# Patient Record
Sex: Male | Born: 1945 | Race: Black or African American | Hispanic: No | Smoking: Current every day smoker
Health system: Southern US, Community
[De-identification: ages and names within clinical notes are randomized; demographics above are authoritative.]

## PROBLEM LIST (undated history)

## (undated) DIAGNOSIS — I639 Cerebral infarction, unspecified: Secondary | ICD-10-CM

## (undated) DIAGNOSIS — J449 Chronic obstructive pulmonary disease, unspecified: Secondary | ICD-10-CM

## (undated) DIAGNOSIS — R569 Unspecified convulsions: Secondary | ICD-10-CM

## (undated) DIAGNOSIS — F1010 Alcohol abuse, uncomplicated: Secondary | ICD-10-CM

## (undated) DIAGNOSIS — I10 Essential (primary) hypertension: Secondary | ICD-10-CM

## (undated) DIAGNOSIS — M109 Gout, unspecified: Secondary | ICD-10-CM

---

## 2001-11-02 ENCOUNTER — Encounter: Payer: Self-pay | Admitting: *Deleted

## 2001-11-02 ENCOUNTER — Emergency Department (HOSPITAL_COMMUNITY): Admission: EM | Admit: 2001-11-02 | Discharge: 2001-11-02 | Payer: Self-pay | Admitting: *Deleted

## 2002-01-22 ENCOUNTER — Emergency Department (HOSPITAL_COMMUNITY): Admission: EM | Admit: 2002-01-22 | Discharge: 2002-01-22 | Payer: Self-pay | Admitting: Emergency Medicine

## 2002-01-22 ENCOUNTER — Encounter: Payer: Self-pay | Admitting: Emergency Medicine

## 2002-08-21 ENCOUNTER — Inpatient Hospital Stay (HOSPITAL_COMMUNITY): Admission: EM | Admit: 2002-08-21 | Discharge: 2002-08-22 | Payer: Self-pay | Admitting: Emergency Medicine

## 2002-08-21 ENCOUNTER — Encounter: Payer: Self-pay | Admitting: Emergency Medicine

## 2002-08-22 ENCOUNTER — Encounter: Payer: Self-pay | Admitting: *Deleted

## 2005-02-07 ENCOUNTER — Emergency Department (HOSPITAL_COMMUNITY): Admission: EM | Admit: 2005-02-07 | Discharge: 2005-02-07 | Payer: Self-pay | Admitting: Emergency Medicine

## 2005-02-11 ENCOUNTER — Emergency Department (HOSPITAL_COMMUNITY): Admission: EM | Admit: 2005-02-11 | Discharge: 2005-02-11 | Payer: Self-pay | Admitting: Emergency Medicine

## 2005-09-25 ENCOUNTER — Emergency Department (HOSPITAL_COMMUNITY): Admission: EM | Admit: 2005-09-25 | Discharge: 2005-09-25 | Payer: Self-pay | Admitting: Emergency Medicine

## 2006-05-15 ENCOUNTER — Emergency Department (HOSPITAL_COMMUNITY): Admission: EM | Admit: 2006-05-15 | Discharge: 2006-05-15 | Payer: Self-pay | Admitting: Emergency Medicine

## 2006-12-19 ENCOUNTER — Emergency Department (HOSPITAL_COMMUNITY): Admission: EM | Admit: 2006-12-19 | Discharge: 2006-12-19 | Payer: Self-pay | Admitting: Emergency Medicine

## 2008-02-02 ENCOUNTER — Emergency Department (HOSPITAL_COMMUNITY): Admission: EM | Admit: 2008-02-02 | Discharge: 2008-02-02 | Payer: Self-pay | Admitting: Emergency Medicine

## 2008-10-31 ENCOUNTER — Emergency Department (HOSPITAL_COMMUNITY): Admission: EM | Admit: 2008-10-31 | Discharge: 2008-10-31 | Payer: Self-pay | Admitting: Emergency Medicine

## 2010-08-19 NOTE — Discharge Summary (Signed)
   NAME:  Jesus Lin, Jesus Lin                     ACCOUNT NO.:  [id number]   MEDICAL RECORD NO.:  [id number]                   PATIENT TYPE:  INP   LOCATION:  A219                                 FACILITY:  [facility]   PHYSICIAN:  Sarita Bottom, M.D.                  DATE OF BIRTH:  [birth year]   DATE OF ADMISSION:  08/21/2002  DATE OF DISCHARGE:                                 DISCHARGE SUMMARY   ADDENDUM:  Mr.  Dalen had a chest CT done the impression of which showed  no evidence of hilar muscle adenopathy.  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  Patient had a 2.9 cm low density cystic structure adjacent to the left side  of the aorta, probably represents a dilated lymphatic.  This is of  indeterminate etiology but has a benign appearance on CT.   Patient will therefore be discharged home today to followup with primary  M.D. as an outpatient.                                                Sarita Bottom, M.D.    DW/MEDQ  D:  08/22/2002  T:  08/22/2002  Job:  [id number]   cc:   [facility]

## 2010-08-19 NOTE — H&P (Signed)
NAMEROMMEL, HOGSTON                     ACCOUNT NO.:  [id number]   MEDICAL RECORD NO.:  [id number]                   PATIENT TYPE:  INP   LOCATION:  IC04                                 FACILITY:  [facility]   PHYSICIAN:  Sarita Bottom, M.D.                  DATE OF BIRTH:  December 23, 1945   DATE OF ADMISSION:  08/21/2002  DATE OF DISCHARGE:                                HISTORY & PHYSICAL   GENERAL DATA:  History is obtained from ER and EMS records as the patient is  obtunded and cannot give any history.  Patient is unassigned.   CHIEF COMPLAINT:  He is unresponsive.   HISTORY OF PRESENT ILLNESS:  Mr. Jesus Lin is a 65 year old man with  a history of chronic alcohol abuse. He was brought in by the emergency  services after friends called EMS after finding him lying on the street.  It  is unclear, at this time, whether the patient had any seizure or any trauma  prior to being found.  EMS found him unresponsive; and, according to the EMS  note, the patient was apneic.  They gave him D50 and 2 grams of Narcan and  transported him to the [facility] Emergency Room.  In the emergency  room the patient was noted to have spontaneous breathing. He was also given  IV thiamine and folic acid; and a CT scan, and C-spine x-rays were ordered.   REVIEW OF SYSTEMS:  Unable to obtain at this time.   PAST MEDICAL HISTORY:  Significant for alcohol abuse.  The patient has come  to the emergency room in the past, several times, with alcohol abuse.  His  other medical problems are unknown.   MEDICATIONS:  Unknown.   ALLERGIES:  Unknown.   FAMILY HISTORY:  Unable to obtain.   PHYSICAL EXAMINATION:  VITAL SIGNS:  On examination his BP is 140/96.  Heart  rate is 96.  Oxygen saturation is 100.  GENERAL:  He is a middle-aged man with alcohol on his breath, deeply asleep,  but opens his eyes to deep sternal rub and mumbles things and goes back to  sleep.  HEAD, EYES, EARS, NOSE, AND  THROAT:  Pupils are extremely limited and  reactive to light.  He is pale.  He has poor oral hygiene.  He is missing  several of his teeth.  Oral mucosa appears dry.  NECK:  He has a cervical collar on his neck.  CHEST:  Air entry is adequate.  He has a lot of transmitted airway sounds.  No wheezes or crackles could be heard.  CARDIOVASCULAR:  Heart sounds 1 and 2 appear normal.  Rhythm is regular.  No  murmurs were appreciated.  ABDOMEN:  He has a scar on his abdomen.  Abdomen is soft.  Bowel sounds are  present.  No tenderness on deep palpation.  His liver is about 2 cm palpable  below  the right costal edge.  His spleen is not palpable.  No masses or  other organomegaly was felt.  CENTRAL NERVOUS SYSTEM:  He is deeply asleep. He arouses to painful stimuli.  His tone is normal in all extremities.  He has normal reflexes.  However, a  complete neurological examination cannot be done at this time.  EXTREMITIES:  He has no edema.  Pulses are 2+ bilaterally.   X-RAY AND LABORATORY DATA:  A CAT scan of the head and C-spine are all  pending.  His ABG shows a pH of 7.25, pCO2 of 51.5, pO2 of 30.7, bicarb is  22.9.  Oxygen saturation is 99.5%.  WBC is 4.6, MCV is 89.9, platelet count  is 170.  Hemoglobin is 11.6.  Sodium 136, potassium 3.9, chloride 104, CO2  24, BUN 13, creatinine 1.1,  glucose 170, calcium 7.8, calculated anion gap  is 8.  His serum amylase is 109, lipase is 64.  Liver function tests: Total  protein is 6.8, albumin is 3.4, SGOT 37, SGPT 16, and total bilirubin is  0.8.  The serum alcohol level is 505 mg/dL.   ASSESSMENT AND PLAN:  1. Alcohol intoxication.  The patient will be admitted to the ICU for     observation.  I will follow up with a CT of the head that was ordered in     the emergency room.  He will be put under seizure precautions. He will be     given thiamine 100 mg daily and folic acid 1 mg daily.  The patient will     be hydrated with normal saline at 125 cc/h.   When he is much more awake     he will be given Ativan q.8h. p.r.n. for his seizure precautions.  2. Respiratory acidosis. The patient has a normal INR.  I do not think that     the patient has any metabolic acidosis at this time; however, I will go     ahead and check his serum osmolality and serum ketones. He will be given     oxygen 2 L via nasal cannula and his pulse oximetry will be monitored on     admission.  The patient will be admitted under the hospitalist service.     Further management will depend on clinical course.                                               Sarita Bottom, M.D.    DW/MEDQ  D:  08/21/2002  T:  08/21/2002  Job:  [id number]

## 2010-08-19 NOTE — Discharge Summary (Signed)
   NAMEROMARIO, Jesus Lin                     ACCOUNT NO.:  [id number]   MEDICAL RECORD NO.:  [id number]                   PATIENT TYPE:  INP   LOCATION:  IC04                                 FACILITY:  [facility]   PHYSICIAN:  Sarita Bottom, M.D.                  DATE OF BIRTH:  27-Jan-1946   DATE OF ADMISSION:  08/21/2002  DATE OF DISCHARGE:                                 DISCHARGE SUMMARY   DISCHARGE DIAGNOSES:  1. Alcoholic intoxication.  2. Right hilar mass.   DISCHARGE MEDICATIONS:  1. Thiamine 100 mg q.d.  2. Folic acid 1 mg q.d.  3. Multivitamin tablet one tablet q.d.   FOLLOW UP:  The patient advised to see family M.D. for follow up of his  right hilar mass.   HISTORY OF PRESENT ILLNESS:  Jesus Lin is a 65 year old man with a  history of chronic alcohol abuse. He was admitted to [facility]  yesterday after he was found lying on the street. The patient had alcohol on  his breath on arrival in the emergency room. He was obtundent. His serum  alcohol level was elevated at 505 mg/dl. His ABG showed a respiratory  acidosis. He had normal anion gap, and he had negative ketones. The patient  was admitted to the ICU for observation and also for seizure precautions,  hydrated with normal saline. Head CT was done was negative for any acute  mass or bleed. A chest CT done revealed a right hilar mass. His clinical  course was significant for improvement in his mentation, arousal from sleep.  He became alert and responsive and appropriate. He, however, refused further  workup for the right hilar mass and decided to sign out against medical  advice. His vital signs on day of discharge was a BP of 124/86, heart rate  of 85, temperature of 98. Generally, a middle-aged man not in any distress.  His chest is clear to auscultation. Heart sounds 1 and 2 are normal. Rhythm  is regular. No murmur. Abdomen is soft. Bowel sounds are present. CNS is  awake, alert and oriented x3. He  has no tremor, and his latest labs shows a  negative acetone. His serum osmolality was 409 prior to hydration yesterday.  The patient will be discharged home today and advised to follow up with  primary M.D. or to see any doctor in the free clinic.                                               Sarita Bottom, M.D.    DW/MEDQ  D:  08/22/2002  T:  08/22/2002  Job:  [id number]

## 2011-01-03 LAB — BASIC METABOLIC PANEL
BUN: 14
CO2: 14 — ABNORMAL LOW
Calcium: 9.1
Chloride: 102
Creatinine, Ser: 1.48
GFR calc Af Amer: 58 — ABNORMAL LOW
GFR calc non Af Amer: 48 — ABNORMAL LOW
Glucose, Bld: 41 — ABNORMAL LOW
Potassium: 4.1
Sodium: 142

## 2011-01-03 LAB — DIFFERENTIAL
Basophils Absolute: 0
Basophils Relative: 0
Eosinophils Absolute: 0
Eosinophils Relative: 0

## 2011-01-03 LAB — CBC
Platelets: 244
RDW: 15.1

## 2011-01-03 LAB — GLUCOSE, CAPILLARY

## 2011-01-03 LAB — POCT CARDIAC MARKERS: Myoglobin, poc: 64.5

## 2011-08-24 ENCOUNTER — Inpatient Hospital Stay (HOSPITAL_COMMUNITY): Payer: Medicare Other

## 2011-08-24 ENCOUNTER — Emergency Department (HOSPITAL_COMMUNITY): Payer: Medicare Other

## 2011-08-24 ENCOUNTER — Encounter (HOSPITAL_COMMUNITY): Payer: Self-pay | Admitting: *Deleted

## 2011-08-24 ENCOUNTER — Inpatient Hospital Stay (HOSPITAL_COMMUNITY)
Admission: EM | Admit: 2011-08-24 | Discharge: 2011-08-26 | DRG: 066 | Disposition: A | Discharge status: Home Health | Payer: Medicare Other | Attending: Internal Medicine | Admitting: Internal Medicine

## 2011-08-24 DIAGNOSIS — I6350 Cerebral infarction due to unspecified occlusion or stenosis of unspecified cerebral artery: Principal | ICD-10-CM | POA: Diagnosis present

## 2011-08-24 DIAGNOSIS — I63511 Cerebral infarction due to unspecified occlusion or stenosis of right middle cerebral artery: Secondary | ICD-10-CM | POA: Diagnosis present

## 2011-08-24 DIAGNOSIS — I6320 Cerebral infarction due to unspecified occlusion or stenosis of unspecified precerebral arteries: Secondary | ICD-10-CM

## 2011-08-24 DIAGNOSIS — J449 Chronic obstructive pulmonary disease, unspecified: Secondary | ICD-10-CM | POA: Diagnosis present

## 2011-08-24 DIAGNOSIS — F1010 Alcohol abuse, uncomplicated: Secondary | ICD-10-CM

## 2011-08-24 DIAGNOSIS — F1020 Alcohol dependence, uncomplicated: Secondary | ICD-10-CM | POA: Diagnosis present

## 2011-08-24 DIAGNOSIS — T148XXA Other injury of unspecified body region, initial encounter: Secondary | ICD-10-CM | POA: Diagnosis present

## 2011-08-24 DIAGNOSIS — Z9181 History of falling: Secondary | ICD-10-CM

## 2011-08-24 DIAGNOSIS — W19XXXA Unspecified fall, initial encounter: Secondary | ICD-10-CM | POA: Diagnosis present

## 2011-08-24 DIAGNOSIS — Z720 Tobacco use: Secondary | ICD-10-CM | POA: Diagnosis present

## 2011-08-24 DIAGNOSIS — IMO0002 Reserved for concepts with insufficient information to code with codable children: Secondary | ICD-10-CM | POA: Diagnosis present

## 2011-08-24 DIAGNOSIS — R279 Unspecified lack of coordination: Secondary | ICD-10-CM | POA: Diagnosis present

## 2011-08-24 DIAGNOSIS — Z8739 Personal history of other diseases of the musculoskeletal system and connective tissue: Secondary | ICD-10-CM | POA: Diagnosis not present

## 2011-08-24 DIAGNOSIS — F17200 Nicotine dependence, unspecified, uncomplicated: Secondary | ICD-10-CM | POA: Diagnosis present

## 2011-08-24 DIAGNOSIS — J4489 Other specified chronic obstructive pulmonary disease: Secondary | ICD-10-CM | POA: Diagnosis present

## 2011-08-24 DIAGNOSIS — M109 Gout, unspecified: Secondary | ICD-10-CM | POA: Diagnosis present

## 2011-08-24 DIAGNOSIS — I639 Cerebral infarction, unspecified: Secondary | ICD-10-CM

## 2011-08-24 DIAGNOSIS — R29810 Facial weakness: Secondary | ICD-10-CM | POA: Diagnosis present

## 2011-08-24 DIAGNOSIS — L259 Unspecified contact dermatitis, unspecified cause: Secondary | ICD-10-CM | POA: Diagnosis present

## 2011-08-24 HISTORY — DX: Chronic obstructive pulmonary disease, unspecified: J44.9

## 2011-08-24 LAB — CBC
HCT: 41.4 % (ref 39.0–52.0)
Hemoglobin: 13.4 g/dL (ref 13.0–17.0)
RDW: 15.6 % — ABNORMAL HIGH (ref 11.5–15.5)
WBC: 5.8 10*3/uL (ref 4.0–10.5)

## 2011-08-24 LAB — URINALYSIS, ROUTINE W REFLEX MICROSCOPIC
Bilirubin Urine: NEGATIVE
Leukocytes, UA: NEGATIVE
Nitrite: NEGATIVE
Specific Gravity, Urine: >1.03 — ABNORMAL HIGH (ref 1.005–1.030)
pH: 6 (ref 5.0–8.0)

## 2011-08-24 LAB — RAPID URINE DRUG SCREEN, HOSP PERFORMED
Benzodiazepines: NOT DETECTED
Cocaine: NOT DETECTED
Opiates: NOT DETECTED

## 2011-08-24 LAB — DIFFERENTIAL
Basophils Absolute: 0 10*3/uL (ref 0.0–0.1)
Lymphocytes Relative: 11 % — ABNORMAL LOW (ref 12–46)
Monocytes Absolute: 0.6 10*3/uL (ref 0.1–1.0)
Neutro Abs: 4.4 10*3/uL (ref 1.7–7.7)

## 2011-08-24 LAB — BASIC METABOLIC PANEL
CO2: 27 mEq/L (ref 19–32)
Chloride: 102 mEq/L (ref 96–112)
Creatinine, Ser: 1.02 mg/dL (ref 0.50–1.35)

## 2011-08-24 LAB — ETHANOL: Alcohol, Ethyl (B): <11 mg/dL (ref 0–11)

## 2011-08-24 MED ORDER — ACETAMINOPHEN 325 MG PO TABS
650.0000 mg | ORAL_TABLET | Freq: Once | ORAL | Status: AC
  Administered 2011-08-24: 650 mg via ORAL
  Filled 2011-08-24: qty 2

## 2011-08-24 MED ORDER — ACETAMINOPHEN 650 MG RE SUPP: 650.0000 mg | RECTAL | Status: DC | PRN

## 2011-08-24 MED ORDER — PNEUMOCOCCAL VAC POLYVALENT 25 MCG/0.5ML IJ INJ
0.5000 mL | INJECTION | INTRAMUSCULAR | Status: AC
  Administered 2011-08-25: 0.5 mL via INTRAMUSCULAR
  Filled 2011-08-24: qty 0.5

## 2011-08-24 MED ORDER — SENNOSIDES-DOCUSATE SODIUM 8.6-50 MG PO TABS: 1.0000 | ORAL_TABLET | Freq: Every evening | ORAL | Status: DC | PRN

## 2011-08-24 MED ORDER — THIAMINE HCL 100 MG/ML IJ SOLN: 100.0000 mg | Freq: Every day | INTRAMUSCULAR | Status: DC

## 2011-08-24 MED ORDER — ACETAMINOPHEN 325 MG PO TABS: 650.0000 mg | ORAL_TABLET | ORAL | Status: DC | PRN

## 2011-08-24 MED ORDER — ENOXAPARIN SODIUM 40 MG/0.4ML ~~LOC~~ SOLN
40.0000 mg | SUBCUTANEOUS | Status: DC
  Administered 2011-08-24 – 2011-08-25 (×2): 40 mg via SUBCUTANEOUS
  Filled 2011-08-24 (×2): qty 0.4

## 2011-08-24 MED ORDER — ADULT MULTIVITAMIN W/MINERALS CH
1.0000 | ORAL_TABLET | Freq: Every day | ORAL | Status: DC
  Administered 2011-08-24 – 2011-08-26 (×3): 1 via ORAL

## 2011-08-24 MED ORDER — FOLIC ACID 1 MG PO TABS
1.0000 mg | ORAL_TABLET | Freq: Every day | ORAL | Status: DC
  Administered 2011-08-24 – 2011-08-26 (×3): 1 mg via ORAL
  Filled 2011-08-24 (×3): qty 1

## 2011-08-24 MED ORDER — ONDANSETRON HCL 4 MG/2ML IJ SOLN: 4.0000 mg | Freq: Four times a day (QID) | INTRAMUSCULAR | Status: DC | PRN

## 2011-08-24 MED ORDER — LORAZEPAM 1 MG PO TABS: 1.0000 mg | ORAL_TABLET | Freq: Four times a day (QID) | ORAL | Status: DC | PRN

## 2011-08-24 MED ORDER — LORAZEPAM 2 MG/ML IJ SOLN: 1.0000 mg | Freq: Four times a day (QID) | INTRAMUSCULAR | Status: DC | PRN

## 2011-08-24 MED ORDER — ASPIRIN 325 MG PO TABS
325.0000 mg | ORAL_TABLET | Freq: Every day | ORAL | Status: DC
  Administered 2011-08-24 – 2011-08-26 (×3): 325 mg via ORAL
  Filled 2011-08-24 (×3): qty 1

## 2011-08-24 MED ORDER — VITAMIN B-1 100 MG PO TABS
100.0000 mg | ORAL_TABLET | Freq: Every day | ORAL | Status: DC
  Administered 2011-08-24 – 2011-08-26 (×3): 100 mg via ORAL
  Filled 2011-08-24 (×3): qty 1

## 2011-08-24 MED ORDER — ASPIRIN 300 MG RE SUPP
300.0000 mg | Freq: Every day | RECTAL | Status: DC
  Filled 2011-08-24 (×5): qty 1

## 2011-08-24 NOTE — ED Notes (Signed)
Pt says his lt foot is "sore"

## 2011-08-24 NOTE — ED Notes (Addendum)
Denies chest pain, states he has been short of breath for one year. Patient c/o right foot pain, no known injury. No obvious signs of injury, no redness. States the pain woke him up last night.  Patient's family reports that he seems disoriented and not his "usual self" Patient does seem confused with tasks, but answers orientation questions appropriately and accurately.

## 2011-08-24 NOTE — ED Notes (Signed)
Sob, "passed out last week", Family says that he cannot walk without assistance that his gait is unsteady.

## 2011-08-24 NOTE — ED Provider Notes (Addendum)
History   This chart was scribed for Donnetta Hutching, MD by Clarita Crane. The patient was seen in room APA08/APA08. Patient's care was started at 1038.    CSN: [id number]  Arrival date & time 08/24/11  1038   First MD Initiated Contact with Patient 08/24/11 1211      Chief Complaint  Patient presents with  . Shortness of Breath    (Consider location/radiation/quality/duration/timing/severity/associated sxs/prior treatment) The history is provided by the patient. History Limited By: Poor Historian.  Jesus Lin is a 66 y.o. male who presents to the Emergency Department accompanied by brother to be evaluated due to syncopal episode which reportedly occurred last week. Patient is amnestic to events of syncopal episode and states that he would not have come to ED to be evaluated if it weren't for the insistence of his brother. Patient states he feels he is at his baseline and has no other complaints at this time. Denies chest pain, nausea, vomiting, diarrhea, weakness. Patient with h/o COPD.   Past Medical History  Diagnosis Date  . COPD (chronic obstructive pulmonary disease)     History reviewed. No pertinent past surgical history.  History reviewed. No pertinent family history.  History  Substance Use Topics  . Smoking status: Current Everyday Smoker  . Smokeless tobacco: Not on file  . Alcohol Use: Yes      Review of Systems A complete 10 system review of systems was obtained and all systems are negative except as noted in the HPI and PMH.   Allergies  Review of patient's allergies indicates no known allergies.  Home Medications  No current outpatient prescriptions on file.  BP 153/98  Pulse 78  Temp(Src) 98.1 F (36.7 C) (Oral)  Resp 20  Ht 5\' 11"  (1.803 m)  Wt 162 lb (73.483 kg)  BMI 22.59 kg/m2  SpO2 100%  Physical Exam  Nursing note and vitals reviewed. Constitutional: He is oriented to person, place, and time. He appears well-developed. No distress.        Thin appearing.   HENT:  Head: Normocephalic and atraumatic.       Poor dentition.   Eyes: EOM are normal. Pupils are equal, round, and reactive to light.  Neck: Neck supple. No tracheal deviation present.  Cardiovascular: Normal rate and regular rhythm.  Exam reveals no gallop and no friction rub.   No murmur heard. Pulmonary/Chest: Effort normal. No respiratory distress. He has no wheezes. He has no rales.  Abdominal: Soft. He exhibits no distension.  Musculoskeletal: Normal range of motion. He exhibits no edema.  Neurological: He is alert and oriented to person, place, and time. No sensory deficit.  Skin: Skin is warm and dry.  Psychiatric: He has a normal mood and affect. His behavior is normal.    ED Course  Procedures (including critical care time)  DIAGNOSTIC STUDIES: Oxygen Saturation is 100% on room air, normal by my interpretation.    COORDINATION OF CARE: 12:30PM-Patient informed of current plan for treatment and evaluation and agrees with plan at this time.   Results for orders placed during the hospital encounter of 08/24/11  CBC      Component Value Range   WBC 5.8  4.0 - 10.5 (K/uL)   RBC 4.59  4.22 - 5.81 (MIL/uL)   Hemoglobin 13.4  13.0 - 17.0 (g/dL)   HCT 78.2  95.6 - 21.3 (%)   MCV 90.2  78.0 - 100.0 (fL)   MCH 29.2  26.0 - 34.0 (pg)  MCHC 32.4  30.0 - 36.0 (g/dL)   RDW 16.1 (*) 09.6 - 15.5 (%)   Platelets 245  150 - 400 (K/uL)  DIFFERENTIAL      Component Value Range   Neutrophils Relative 77  43 - 77 (%)   Neutro Abs 4.4  1.7 - 7.7 (K/uL)   Lymphocytes Relative 11 (*) 12 - 46 (%)   Lymphs Abs 0.6 (*) 0.7 - 4.0 (K/uL)   Monocytes Relative 11  3 - 12 (%)   Monocytes Absolute 0.6  0.1 - 1.0 (K/uL)   Eosinophils Relative 1  0 - 5 (%)   Eosinophils Absolute 0.1  0.0 - 0.7 (K/uL)   Basophils Relative 0  0 - 1 (%)   Basophils Absolute 0.0  0.0 - 0.1 (K/uL)  BASIC METABOLIC PANEL      Component Value Range   Sodium 141  135 - 145 (mEq/L)    Potassium 3.7  3.5 - 5.1 (mEq/L)   Chloride 102  96 - 112 (mEq/L)   CO2 27  19 - 32 (mEq/L)   Glucose, Bld 102 (*) 70 - 99 (mg/dL)   BUN 8  6 - 23 (mg/dL)   Creatinine, Ser 0.45  0.50 - 1.35 (mg/dL)   Calcium 9.1  8.4 - 40.9 (mg/dL)   GFR calc non Af Amer 76 (*) >90 (mL/min)   GFR calc Af Amer 88 (*) >90 (mL/min)  ETHANOL      Component Value Range   Alcohol, Ethyl (B) <11  0 - 11 (mg/dL)  POCT I-STAT TROPONIN I      Component Value Range   Troponin i, poc 0.02  0.00 - 0.08 (ng/mL)   Comment 3             Labs Reviewed  CBC - Abnormal; Notable for the following:    RDW 15.6 (*)    All other components within normal limits  DIFFERENTIAL - Abnormal; Notable for the following:    Lymphocytes Relative 11 (*)    Lymphs Abs 0.6 (*)    All other components within normal limits  POCT I-STAT TROPONIN I  BASIC METABOLIC PANEL  ETHANOL  URINALYSIS, ROUTINE W REFLEX MICROSCOPIC  URINE RAPID DRUG SCREEN (HOSP PERFORMED)   Dg Chest 1 View  08/24/2011  *RADIOLOGY REPORT*  Clinical Data: Dyspnea.  CHEST - 1 VIEW  Comparison: 02/02/2008.  Findings: The heart remains normal in size.  The lungs remain clear and hyperexpanded.  Tortuous aorta.  Mild scoliosis.  IMPRESSION: Stable changes of COPD.  No acute abnormality.  Original Report Authenticated By: Darrol Angel, M.D.  Dg Chest 1 View  08/24/2011  *RADIOLOGY REPORT*  Clinical Data: Dyspnea.  CHEST - 1 VIEW  Comparison: 02/02/2008.  Findings: The heart remains normal in size.  The lungs remain clear and hyperexpanded.  Tortuous aorta.  Mild scoliosis.  IMPRESSION: Stable changes of COPD.  No acute abnormality.  Original Report Authenticated By: Darrol Angel, M.D.   Ct Head Wo Contrast  08/24/2011  *RADIOLOGY REPORT*  Clinical Data: Syncope last week.  Unsteady gait  CT HEAD WITHOUT CONTRAST  Technique:  Contiguous axial images were obtained from the base of the skull through the vertex without contrast.  Comparison: CT 02/07/2005  Findings:  Hypodensity in the right insular cortex and right parietal lobe compatible with acute infarct.  There may be mild involvement of the right basal ganglia as well.  This infarct involves approximately one third of the right MCA territory. Negative  for hemorrhage.  No midline shift.  Generalized atrophy of a moderate degree.  No hemorrhage or mass lesion.  Increased density in the distal right middle cerebral artery in the sylvian fissure may represent thrombosis.  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  Critical Value/emergent results were called by telephone at the time of interpretation on 08/24/2011  at 1510 hours  to  Dr. Adriana Simas, who verbally acknowledged these results.  Original Report Authenticated By: Camelia Phenes, M.D.    No diagnosis found.   Date: 08/24/2011  Rate: 76  Rhythm: normal sinus rhythm  QRS Axis: left  Intervals: normal  ST/T Wave abnormalities: normal  Conduction Disutrbances:none  Narrative Interpretation:   Old EKG Reviewed: changes noted CRITICAL CARE Performed by: Donnetta Hutching  ?  Total critical care time: 30  Critical care time was exclusive of separately billable procedures and treating other patients.  Critical care was necessary to treat or prevent imminent or life-threatening deterioration.  Critical care was time spent personally by me on the following activities: development of treatment plan with patient and/or surrogate as well as nursing, discussions with consultants, evaluation of patient's response to treatment, examination of patient, obtaining history from patient or surrogate, ordering and performing treatments and interventions, ordering and review of laboratory studies, ordering and review of radiographic studies, pulse oximetry and re-evaluation of patient's condition.  MDM  patient has known COPD.  He is a heavy drinker.  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  Discussed with general medicine. Admit.      I personally performed the services described in this documentation, which was scribed in my presence. The recorded information has been reviewed and considered.    Donnetta Hutching, MD 08/24/11 1437  Donnetta Hutching, MD 08/24/11 1446  Donnetta Hutching, MD 08/24/11 [phone]

## 2011-08-24 NOTE — ED Notes (Signed)
Report given to Cindy, RN unit 300. Ready to receive patient. 
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
## 2011-08-25 ENCOUNTER — Inpatient Hospital Stay (HOSPITAL_COMMUNITY): Payer: Medicare Other

## 2011-08-25 DIAGNOSIS — I6320 Cerebral infarction due to unspecified occlusion or stenosis of unspecified precerebral arteries: Secondary | ICD-10-CM

## 2011-08-25 DIAGNOSIS — Z8739 Personal history of other diseases of the musculoskeletal system and connective tissue: Secondary | ICD-10-CM | POA: Diagnosis not present

## 2011-08-25 DIAGNOSIS — I369 Nonrheumatic tricuspid valve disorder, unspecified: Secondary | ICD-10-CM

## 2011-08-25 DIAGNOSIS — M109 Gout, unspecified: Secondary | ICD-10-CM

## 2011-08-25 DIAGNOSIS — F1010 Alcohol abuse, uncomplicated: Secondary | ICD-10-CM

## 2011-08-25 LAB — URIC ACID: Uric Acid, Serum: 8.9 mg/dL — ABNORMAL HIGH (ref 4.0–7.8)

## 2011-08-25 LAB — HEMOGLOBIN A1C
Hgb A1c MFr Bld: 5.6 % (ref <5.7)
Mean Plasma Glucose: 114 mg/dL (ref <117)

## 2011-08-25 LAB — LIPID PANEL: LDL Cholesterol: 83 mg/dL (ref 0–99)

## 2011-08-25 MED ORDER — SODIUM CHLORIDE 0.9 % IJ SOLN
INTRAMUSCULAR | Status: AC
  Administered 2011-08-25: 22:00:00
  Filled 2011-08-25: qty 3

## 2011-08-25 MED ORDER — COLCHICINE 0.6 MG PO TABS
1.2000 mg | ORAL_TABLET | Freq: Once | ORAL | Status: AC
  Administered 2011-08-25: 1.2 mg via ORAL
  Filled 2011-08-25: qty 2

## 2011-08-25 MED ORDER — CLONIDINE HCL 0.1 MG PO TABS: 0.1000 mg | ORAL_TABLET | Freq: Four times a day (QID) | ORAL | Status: DC | PRN

## 2011-08-25 MED ORDER — SODIUM CHLORIDE 0.9 % IJ SOLN
INTRAMUSCULAR | Status: AC
  Administered 2011-08-25: 10 mL
  Filled 2011-08-25: qty 3

## 2011-08-25 MED ORDER — HYDROCORTISONE 1 % EX CREA
TOPICAL_CREAM | Freq: Two times a day (BID) | CUTANEOUS | Status: DC
  Administered 2011-08-25 (×2): 1 via TOPICAL
  Administered 2011-08-26: 10:00:00 via TOPICAL
  Filled 2011-08-25 (×2): qty 1.5

## 2011-08-25 MED ORDER — COLCHICINE 0.6 MG PO TABS
0.6000 mg | ORAL_TABLET | Freq: Every day | ORAL | Status: DC
  Administered 2011-08-26: 0.6 mg via ORAL
  Filled 2011-08-25: qty 1

## 2011-08-25 MED ORDER — INDOMETHACIN 25 MG PO CAPS
50.0000 mg | ORAL_CAPSULE | Freq: Two times a day (BID) | ORAL | Status: DC
  Administered 2011-08-25: 50 mg via ORAL
  Filled 2011-08-25: qty 2

## 2011-08-25 NOTE — Progress Notes (Signed)
Subjective: Complaining of foot pain. Unable to work with physical therapy because unable to bear weight. Has had gout in the past. Now left foot hurts as well.  Objective: Vital signs in last 24 hours: Filed Vitals:   08/24/11 2126 08/25/11 0518 08/25/11 1000 08/25/11 1400  BP: 127/80 169/100 125/77 117/74  Pulse: 59 55 58 65  Temp: 98.7 F (37.1 C) 98.6 F (37 C)  98.4 F (36.9 C)  TempSrc: Oral Oral  Oral  Resp: 18 18  18   Height:      Weight:      SpO2: 95% 96%  96%   Weight change:   Intake/Output Summary (Last 24 hours) at 08/25/11 1810 Last data filed at 08/25/11 1803  Gross per 24 hour  Intake    600 ml  Output    375 ml  Net    225 ml   General: Alert oriented and appropriate Lungs clear to auscultation bilaterally without wheeze rhonchi or rales Cardiovascular regular rate rhythm without murmurs gas rubs Abdomen soft nontender nondistended Extremities no clubbing or cyanosis left MTP joint is now erythematous and tender. Right foot has erythema along the medial instep which was not previous yesterday. Slight tender mass. Neurologic: Finger to nose slightly better on the left compared to yesterday. Still slight weakness of the left arm and leg. Otherwise exam unchanged.  Lab Results: Basic Metabolic Panel:  Lab 08/24/11 1610  NA 141  K 3.7  CL 102  CO2 27  GLUCOSE 102*  BUN 8  CREATININE 1.02  CALCIUM 9.1  MG --  PHOS --   Liver Function Tests: No results found for this basename: AST:2,ALT:2,ALKPHOS:2,BILITOT:2,PROT:2,ALBUMIN:2 in the last 168 hours No results found for this basename: LIPASE:2,AMYLASE:2 in the last 168 hours No results found for this basename: AMMONIA:2 in the last 168 hours CBC:  Lab 08/24/11 1316  WBC 5.8  NEUTROABS 4.4  HGB 13.4  HCT 41.4  MCV 90.2  PLT 245   Cardiac Enzymes: No results found for this basename: CKTOTAL:3,CKMB:3,CKMBINDEX:3,TROPONINI:3 in the last 168 hours BNP: No results found for this basename: PROBNP:3  in the last 168 hours D-Dimer: No results found for this basename: DDIMER:2 in the last 168 hours CBG: No results found for this basename: GLUCAP:6 in the last 168 hours Hemoglobin A1C:  Lab 08/25/11 0525  HGBA1C 5.6   Fasting Lipid Panel:  Lab 08/25/11 0531  CHOL 151  HDL 52  LDLCALC 83  TRIG 82  CHOLHDL 2.9  LDLDIRECT --   Thyroid Function Tests: No results found for this basename: TSH,T4TOTAL,FREET4,T3FREE,THYROIDAB in the last 168 hours Coagulation: No results found for this basename: LABPROT:4,INR:4 in the last 168 hours Anemia Panel: No results found for this basename: VITAMINB12,FOLATE,FERRITIN,TIBC,IRON,RETICCTPCT in the last 168 hours Urine Drug Screen: Drugs of Abuse     Component Value Date/Time   LABOPIA NONE DETECTED 08/24/2011 1451   COCAINSCRNUR NONE DETECTED 08/24/2011 1451   LABBENZ NONE DETECTED 08/24/2011 1451   AMPHETMU NONE DETECTED 08/24/2011 1451   THCU NONE DETECTED 08/24/2011 1451   LABBARB NONE DETECTED 08/24/2011 1451    Alcohol Level:  Lab 08/24/11 1316  ETH <11   Urinalysis:  Lab 08/24/11 1451  COLORURINE YELLOW  LABSPEC >1.030*  PHURINE 6.0  GLUCOSEU NEGATIVE  HGBUR NEGATIVE  BILIRUBINUR NEGATIVE  KETONESUR NEGATIVE  PROTEINUR NEGATIVE  UROBILINOGEN 0.2  NITRITE NEGATIVE  LEUKOCYTESUR NEGATIVE    Micro Results: No results found for this or any previous visit (from the past 240 hour(s)). Studies/Results:  Dg Chest 1 View  08/24/2011  *RADIOLOGY REPORT*  Clinical Data: Dyspnea.  CHEST - 1 VIEW  Comparison: 02/02/2008.  Findings: The heart remains normal in size.  The lungs remain clear and hyperexpanded.  Tortuous aorta.  Mild scoliosis.  IMPRESSION: Stable changes of COPD.  No acute abnormality.  Original Report Authenticated By: Darrol Angel, M.D.   Ct Head Wo Contrast  08/24/2011  *RADIOLOGY REPORT*  Clinical Data: Syncope last week.  Unsteady gait  CT HEAD WITHOUT CONTRAST  Technique:  Contiguous axial images were obtained  from the base of the skull through the vertex without contrast.  Comparison: CT 02/07/2005  Findings: Hypodensity in the right insular cortex and right parietal lobe compatible with acute infarct.  There may be mild involvement of the right basal ganglia as well.  This infarct involves approximately one third of the right MCA territory. Negative for hemorrhage.  No midline shift.  Generalized atrophy of a moderate degree.  No hemorrhage or mass lesion.  Increased density in the distal right middle cerebral artery in the sylvian fissure may represent thrombosis.  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  Critical Value/emergent results were called by telephone at the time of interpretation on 08/24/2011  at 1510 hours  to  Dr. Adriana Simas, who verbally acknowledged these results.  Original Report Authenticated By: Camelia Phenes, M.D.   Mr Brain Wo Contrast  08/25/2011  *RADIOLOGY REPORT*  Clinical Data:  .  Weakness.  Left-sided facial droop.  MRI HEAD WITHOUT CONTRAST MRA HEAD WITHOUT CONTRAST  Technique: Multiplanar, multiecho pulse sequences of the brain and surrounding structures were obtained according to standard protocol without intravenous contrast.  Angiographic images of the head were obtained using MRA technique without contrast.  Comparison: 08/24/2011 head CT.  No comparison MR.  MRI HEAD  Findings:  Motion degraded exam.  Large right middle cerebral artery distribution acute non hemorrhagic infarct.  Mild mass effect upon the right lateral ventricle.  Minimal bowing of the septum to the left.  This infarct extends from the right temporal lobe, involving the right opercular region and right basal ganglia into the right frontal lobe and parietal lobe.  Atrophy without hydrocephalus.  Small vessel disease type changes.  No intracranial mass lesion detected on this unenhanced motion degraded exam.  Abnormal appearance of the right middle cerebral artery.  Please see below.  Mild  exophthalmos.  Paranasal sinus minimal to mild mucosal thickening.  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  MRA HEAD  Findings: Exam limited by motion.  This limits the evaluation for grading stenosis or detecting aneurysm.  There is flow within the M1 segment of the right middle cerebral artery however, decreased number of visualized right middle cerebral artery branches consistent with occlusion contributing to the patient's infarct.  Intracranial vasculature appears ectatic with branch vessel narrowing and irregularity suspected but not able to be adequately assessed.  IMPRESSION: Exam limited by motion.  This limits the evaluation for grading stenosis or detecting aneurysm.  There is flow within the M1 segment of the right middle cerebral artery however, decreased number of visualized right middle cerebral artery branches consistent with occlusion contributing to the patient's infarct.  Original Report Authenticated By: Fuller Canada, M.D.   US Carotid Duplex Bilateral  08/25/2011  *RADIOLOGY REPORT*  Clinical Data: Acute cerebral infarction in the right middle cerebral artery territory. History of hypertension and  tobacco use.  BILATERAL CAROTID DUPLEX ULTRASOUND  Technique: Wallace Cullens scale imaging, color Doppler and duplex ultrasound was performed of bilateral carotid and vertebral arteries in the neck.  Comparison:  None.  Criteria:  Quantification of carotid stenosis is based on velocity parameters that correlate the residual internal carotid diameter with NASCET-based stenosis levels, using the diameter of the distal internal carotid lumen as the denominator for stenosis measurement.  The following velocity measurements were obtained:                   PEAK SYSTOLIC/END DIASTOLIC RIGHT ICA:                        90/21cm/sec CCA:                        105/10cm/sec SYSTOLIC ICA/CCA RATIO:      0.9 DIASTOLIC ICA/CCA RATIO:    2.1 ECA:                        97cm/sec  LEFT ICA:                        87/19cm/sec CCA:                        139/18cm/sec SYSTOLIC ICA/CCA RATIO:     0.6 DIASTOLIC ICA/CCA RATIO:    1.0 ECA:                        97cm/sec  Findings:  RIGHT CAROTID ARTERY: Moderate amount of calcified and noncalcified plaque present beginning at the level of the distal common carotid artery and extending through the bulb and into origins of both internal and external carotid arteries.  Plaque surface is irregular with color Doppler showing focal areas of probable plaque ulceration in the proximal internal carotid artery.  This may certainly have been a source of the emboli. Estimated right ICA stenosis is less than 50%.  RIGHT VERTEBRAL ARTERY:  Antegrade flow with normal wave form.  LEFT CAROTID ARTERY: Moderate amount of calcified and noncalcified plaque present.  Similar to the right side, focal areas of plaque ulceration are suspected.  Estimated ICA stenosis is less than 50% based on velocities.  LEFT VERTEBRAL ARTERY:  Antegrade flow with normal wave form.  IMPRESSION:  Significant atherosclerotic plaque at both carotid bifurcations. Although estimated ICA stenoses are less 50% based on velocities, focal areas of plaque ulceration are suspected bilaterally which certainly may be foci for emboli.  Original Report Authenticated By: Reola Calkins, M.D.   Mr Mra Head/brain Wo Cm  08/25/2011  *RADIOLOGY REPORT*  Clinical Data:  .  Weakness.  Left-sided facial droop.  MRI HEAD WITHOUT CONTRAST MRA HEAD WITHOUT CONTRAST  Technique: Multiplanar, multiecho pulse sequences of the brain and surrounding structures were obtained according to standard protocol without intravenous contrast.  Angiographic images of the head were obtained using MRA technique without contrast.  Comparison: 08/24/2011 head CT.  No comparison MR.  MRI HEAD  Findings:  Motion degraded exam.  Large right middle cerebral  artery distribution acute non hemorrhagic infarct.  Mild mass effect upon the right lateral ventricle.  Minimal bowing of the septum to the left.  This infarct extends from the right temporal lobe, involving the right opercular region and right basal ganglia into the right frontal lobe  and parietal lobe.  Atrophy without hydrocephalus.  Small vessel disease type changes.  No intracranial mass lesion detected on this unenhanced motion degraded exam.  Abnormal appearance of the right middle cerebral artery.  Please see below.  Mild exophthalmos.  Paranasal sinus minimal to mild mucosal thickening.  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  MRA HEAD  Findings: Exam limited by motion.  This limits the evaluation for grading stenosis or detecting aneurysm.  There is flow within the M1 segment of the right middle cerebral artery however, decreased number of visualized right middle cerebral artery branches consistent with occlusion contributing to the patient's infarct.  Intracranial vasculature appears ectatic with branch vessel narrowing and irregularity suspected but not able to be adequately assessed.  IMPRESSION: Exam limited by motion.  This limits the evaluation for grading stenosis or detecting aneurysm.  There is flow within the M1 segment of the right middle cerebral artery however, decreased number of visualized right middle cerebral artery branches consistent with occlusion contributing to the patient's infarct.  Original Report Authenticated By: Fuller Canada, M.D.   Scheduled Meds:   . aspirin  325 mg Oral Daily  . colchicine  0.6 mg Oral Daily  . colchicine  1.2 mg Oral Once  . enoxaparin  40 mg Subcutaneous Q24H  . folic acid  1 mg Oral Daily  . hydrocortisone cream   Topical BID  . indomethacin  50 mg Oral BID WC  . mulitivitamin with minerals  1 tablet Oral Daily  . pneumococcal  23 valent vaccine  0.5 mL Intramuscular Tomorrow-1000  . thiamine  100 mg Oral Daily  . DISCONTD: aspirin  300 mg Rectal Daily  . DISCONTD: thiamine  100 mg Intravenous Daily   Continuous Infusions:  PRN Meds:.acetaminophen, cloNIDine, LORazepam, LORazepam, ondansetron (ZOFRAN) IV, senna-docusate, DISCONTD: acetaminophen Assessment/Plan: Principal Problem: Subacute ischemic right MCA stroke with right MCA occlusion noted on MRA, and ulcerated plaques on carotid Dopplers, likely embolic. Because of the subacute nature of the stroke, patient's continued improvement, large stroke area, alcohol abuse and fall risk, will not start anticoagulation because of risk of bleed/hemorrhagic conversion, et Karie Soda. Continue PT, OT. Patient does not wish to quit drinking or smoking. Symptoms are improving.  Alcohol abuse, no evidence of withdrawal.  Tobacco abuse, does not wish to quit. Counseled against.  Acute gout of the left MTP and probably right foot. Will give a few doses of colchicine and limited indomethacin. Check a uric acid.   LOS: 1 day   Errin Chewning L 08/25/2011, 6:10 PM

## 2011-08-25 NOTE — Clinical Social Work Psychosocial (Signed)
Clinical Social Work Department BRIEF PSYCHOSOCIAL ASSESSMENT 08/25/2011  Patient:  Jesus Lin, Jesus Lin     Account Number:  [id number]     Admit date:  08/24/2011  Clinical Social Worker:  Sherrlyn Hock  Date/Time:  08/25/2011 09:00 AM  Referred by:  Physician  Date Referred:  08/25/2011 Referred for  Other - See comment   Other Referral:   loss of mother   Interview type:  Patient Other interview type:    PSYCHOSOCIAL DATA Living Status:  ALONE Admitted from facility:   Level of care:   Primary support name:  Mitzi Davenport Primary support relationship to patient:  SIBLING Degree of support available:   adequate    CURRENT CONCERNS Current Concerns  Other - See comment   Other Concerns:   loss of mother    SOCIAL WORK ASSESSMENT / PLAN CSW met with pt at bedside following MD referral for the loss of his mother.  Pt alert and oriented and reports he has lived alone for the past month since his mother's death.  Pt's sister is best support and plans to move in with pt by pt report.  Pt has history of heavy drinking but states he has no desire to cut back at all.  No change in amount since his mother's death.  Pt feels he is coping relatively well concerning the loss of his mother and does not feel the need to follow up with any grief support or counseling.  Chaplain also consulted.  CSW provided brief support.   Assessment/plan status:  No Further Intervention Required Other assessment/ plan:   Information/referral to community resources:   Pt declines any need for resource referral    PATIENT'S/FAMILY'S RESPONSE TO PLAN OF CARE: Pt plans to return home at d/c to live with his sister. Per History and Physical, pt counseled extensively to stop drinking but pt has no intention to comply with this.  CSW will sign off unless needs arise prior to d/c.        Derenda Fennel, LCSW [phone]

## 2011-08-25 NOTE — Progress Notes (Signed)
*  PRELIMINARY RESULTS* Echocardiogram 2D Echocardiogram has been performed.  Jesus Lin 08/25/2011, 9:07 AM

## 2011-08-25 NOTE — Evaluation (Signed)
Physical Therapy Evaluation Patient Details Name: Jesus Lin MRN: [id number] DOB: July 15, 1945 Today's Date: 08/25/2011 Time: 9528-4132 PT Time Calculation (min): 45 min  PT Assessment / Plan / Recommendation Clinical Impression  Pt was seen for eval.  He is alert, cooperative.  His major problem right now appears to be a very painful medial R ankle and mildly painful L great toe.  He has significant erythema and warmth  of R medial ankle and L 1st metatarsal  head (?gout).  He states that he has had gout in the past.  I was unable to evaluate gait because of pain with weight bearing.  He now requires a walker to reduce pain on weight bearing.  There were no significant strength or coordination deficits from stroke.  I could not evaluate standing balance.  He has no walker, so one will need to be obtained for him prior to discharge.  If he is discharged prior to resolution of foot pain, he will need HHPT for gait training and home safety.  My best guess is that there are no significant deficits from stroke. I will plan on having him seen by PT  tomorrow.                 PT Assessment  Patient needs continued PT services    Follow Up Recommendations  Home health PT    Barriers to Discharge None      lEquipment Recommendations  Rolling walker with 5" wheels    Recommendations for Other Services     Frequency Min 5X/week    Precautions / Restrictions Precautions Precautions: Fall Restrictions Weight Bearing Restrictions: No   Pertinent Vitals/Pain       Mobility  Bed Mobility Bed Mobility: Supine to Sit;Sit to Supine Supine to Sit: 7: Independent Sit to Supine: 7: Independent Transfers Transfers: Sit to Stand;Stand to Sit Sit to Stand: 6: Modified independent (Device/Increase time) Stand to Sit: 6: Modified independent (Device/Increase time) Ambulation/Gait Ambulation/Gait Assistance: 4: Min guard Ambulation Distance (Feet): 10 Feet Assistive device: Rolling  walker Ambulation/Gait Assistance Details: pt normally uses no assistive device for gait...he how requires a walker for gait because of severe pain in R ankle with weight bearing...my best guess is that without this pain, his gait would be at baseline Gait Pattern: Antalgic General Gait Details: minimally weight bearing R foot due to pain Stairs: No Wheelchair Mobility Wheelchair Mobility: No    Exercises     PT Diagnosis: Difficulty walking;Acute pain  PT Problem List: Decreased mobility;Decreased activity tolerance;Decreased safety awareness;Pain;Decreased knowledge of use of DME PT Treatment Interventions: DME instruction;Gait training;Stair training;Functional mobility training;Patient/family education   PT Goals Acute Rehab PT Goals PT Goal Formulation: With patient Pt will Ambulate: 16 - 50 feet;with supervision;with least restrictive assistive device PT Goal: Ambulate - Progress: Goal set today Pt will Go Up / Down Stairs: 3-5 stairs;with supervision;with rail(s) PT Goal: Up/Down Stairs - Progress: Goal set today  Visit Information  Last PT Received On: 08/25/11    Subjective Data  Subjective: no c/o Patient Stated Goal: return home   Prior Functioning  Home Living Lives With: Family Available Help at Discharge: Family Type of Home: House Home Access: Stairs to enter Secretary/administrator of Steps: 3 Entrance Stairs-Rails: None Home Layout: Two level;Able to live on main level with bedroom/bathroom;Full bath on main level Alternate Level Stairs-Number of Steps: does not climb steps to 2nd floor Bathroom Shower/Tub: Engineer, manufacturing systems: Standard Home Adaptive Equipment: None Prior Function Level  of Independence: Independent Able to Take Stairs?: Yes Driving: No Vocation: Retired Musician: No difficulties Dominant Hand: Right    Cognition  Overall Cognitive Status: Appears within functional limits for tasks  assessed/performed Arousal/Alertness: Awake/alert Orientation Level: Appears intact for tasks assessed Behavior During Session: [facility] for tasks performed    Extremity/Trunk Assessment Right Upper Extremity Assessment RUE ROM/Strength/Tone: Within functional levels RUE Sensation: WFL - Light Touch;WFL - Proprioception RUE Coordination: WFL - gross motor Left Upper Extremity Assessment LUE ROM/Strength/Tone: Within functional levels LUE Sensation: WFL - Light Touch;WFL - Proprioception LUE Coordination: WFL - gross motor Right Lower Extremity Assessment RLE ROM/Strength/Tone: WFL for tasks assessed (medial ankle red and warm-painful) RLE Sensation: WFL - Light Touch;WFL - Proprioception RLE Coordination: WFL - gross motor Left Lower Extremity Assessment LLE ROM/Strength/Tone: WFL for tasks assessed LLE ROM/Strength/Tone Deficits: head of 1st metatarsal is very red and warm LLE Sensation: WFL - Light Touch;WFL - Proprioception LLE Coordination: WFL - gross motor Trunk Assessment Trunk Assessment: Normal   Balance Balance Balance Assessed: Yes Static Sitting Balance Static Sitting - Level of Assistance: 7: Independent Dynamic Sitting Balance Dynamic Sitting - Level of Assistance: 7: Independent  End of Session PT - End of Session Equipment Utilized During Treatment: Gait belt Activity Tolerance: Patient tolerated treatment well Patient left: in bed;with call bell/phone within reach Nurse Communication: Mobility status   Konrad Penta 08/25/2011, 11:06 AM

## 2011-08-25 NOTE — Consult Note (Signed)
Reason for Consult: Referring Physician:   [name] is an 66 y.o. male.  HPI   Past Medical History  Diagnosis Date  . COPD (chronic obstructive pulmonary disease)     History reviewed. No pertinent past surgical history.  History reviewed. No pertinent family history.  Social History:  reports that he has been smoking.  He does not have any smokeless tobacco history on file. He reports that he drinks alcohol. He reports that he does not use illicit drugs.  Allergies: No Known Allergies  Medications:  Prior to Admission medications   Not on File    Scheduled Meds:   . acetaminophen  650 mg Oral Once  . aspirin  300 mg Rectal Daily   Or  . aspirin  325 mg Oral Daily  . enoxaparin  40 mg Subcutaneous Q24H  . folic acid  1 mg Oral Daily  . hydrocortisone cream   Topical BID  . mulitivitamin with minerals  1 tablet Oral Daily  . pneumococcal 23 valent vaccine  0.5 mL Intramuscular Tomorrow-1000  . thiamine  100 mg Oral Daily   Or  . thiamine  100 mg Intravenous Daily   Continuous Infusions:  PRN Meds:.acetaminophen, acetaminophen, cloNIDine, LORazepam, LORazepam, ondansetron (ZOFRAN) IV, senna-docusate   Results for orders placed during the hospital encounter of 08/24/11 (from the past 48 hour(s))  CBC     Status: Abnormal   Collection Time   08/24/11  1:16 PM      Component Value Range Comment   WBC 5.8  4.0 - 10.5 (K/uL)    RBC 4.59  4.22 - 5.81 (MIL/uL)    Hemoglobin 13.4  13.0 - 17.0 (g/dL)    HCT 96.0  45.4 - 09.8 (%)    MCV 90.2  78.0 - 100.0 (fL)    MCH 29.2  26.0 - 34.0 (pg)    MCHC 32.4  30.0 - 36.0 (g/dL)    RDW 11.9 (*) 14.7 - 15.5 (%)    Platelets 245  150 - 400 (K/uL)   DIFFERENTIAL     Status: Abnormal   Collection Time   08/24/11  1:16 PM      Component Value Range Comment   Neutrophils Relative 77  43 - 77 (%)    Neutro Abs 4.4  1.7 - 7.7 (K/uL)    Lymphocytes Relative 11 (*) 12 - 46 (%)    Lymphs Abs 0.6 (*) 0.7 - 4.0 (K/uL)    Monocytes Relative 11  3 - 12 (%)    Monocytes Absolute 0.6  0.1 - 1.0 (K/uL)    Eosinophils Relative 1  0 - 5 (%)    Eosinophils Absolute 0.1  0.0 - 0.7 (K/uL)    Basophils Relative 0  0 - 1 (%)    Basophils Absolute 0.0  0.0 - 0.1 (K/uL)   BASIC METABOLIC PANEL     Status: Abnormal   Collection Time   08/24/11  1:16 PM      Component Value Range Comment   Sodium 141  135 - 145 (mEq/L)    Potassium 3.7  3.5 - 5.1 (mEq/L)    Chloride 102  96 - 112 (mEq/L)    CO2 27  19 - 32 (mEq/L)    Glucose, Bld 102 (*) 70 - 99 (mg/dL)    BUN 8  6 - 23 (mg/dL)    Creatinine, Ser 8.29  0.50 - 1.35 (mg/dL)    Calcium 9.1  8.4 - 10.5 (mg/dL)  GFR calc non Af Amer 76 (*) >90 (mL/min)    GFR calc Af Amer 88 (*) >90 (mL/min)   ETHANOL     Status: Normal   Collection Time   08/24/11  1:16 PM      Component Value Range Comment   Alcohol, Ethyl (B) <11  0 - 11 (mg/dL)   POCT I-STAT TROPONIN I     Status: Normal   Collection Time   08/24/11  1:31 PM      Component Value Range Comment   Troponin i, poc 0.02  0.00 - 0.08 (ng/mL)    Comment 3            URINALYSIS, ROUTINE W REFLEX MICROSCOPIC     Status: Abnormal   Collection Time   08/24/11  2:51 PM      Component Value Range Comment   Color, Urine YELLOW  YELLOW     APPearance CLEAR  CLEAR     Specific Gravity, Urine >1.030 (*) 1.005 - 1.030     pH 6.0  5.0 - 8.0     Glucose, UA NEGATIVE  NEGATIVE (mg/dL)    Hgb urine dipstick NEGATIVE  NEGATIVE     Bilirubin Urine NEGATIVE  NEGATIVE     Ketones, ur NEGATIVE  NEGATIVE (mg/dL)    Protein, ur NEGATIVE  NEGATIVE (mg/dL)    Urobilinogen, UA 0.2  0.0 - 1.0 (mg/dL)    Nitrite NEGATIVE  NEGATIVE     Leukocytes, UA NEGATIVE  NEGATIVE  MICROSCOPIC NOT DONE ON URINES WITH NEGATIVE PROTEIN, BLOOD, LEUKOCYTES, NITRITE, OR GLUCOSE <1000 mg/dL.  URINE RAPID DRUG SCREEN (HOSP PERFORMED)     Status: Normal   Collection Time   08/24/11  2:51 PM      Component Value Range Comment   Opiates NONE DETECTED  NONE  DETECTED     Cocaine NONE DETECTED  NONE DETECTED     Benzodiazepines NONE DETECTED  NONE DETECTED     Amphetamines NONE DETECTED  NONE DETECTED     Tetrahydrocannabinol NONE DETECTED  NONE DETECTED     Barbiturates NONE DETECTED  NONE DETECTED    LIPID PANEL     Status: Normal   Collection Time   08/25/11  5:31 AM      Component Value Range Comment   Cholesterol 151  0 - 200 (mg/dL)    Triglycerides 82  <161 (mg/dL)    HDL 52  >09 (mg/dL)    Total CHOL/HDL Ratio 2.9      VLDL 16  0 - 40 (mg/dL)    LDL Cholesterol 83  0 - 99 (mg/dL)     Dg Chest 1 View  09/05/5407  *RADIOLOGY REPORT*  Clinical Data: Dyspnea.  CHEST - 1 VIEW  Comparison: 02/02/2008.  Findings: The heart remains normal in size.  The lungs remain clear and hyperexpanded.  Tortuous aorta.  Mild scoliosis.  IMPRESSION: Stable changes of COPD.  No acute abnormality.  Original Report Authenticated By: Darrol Angel, M.D.   Ct Head Wo Contrast  08/24/2011  *RADIOLOGY REPORT*  Clinical Data: Syncope last week.  Unsteady gait  CT HEAD WITHOUT CONTRAST  Technique:  Contiguous axial images were obtained from the base of the skull through the vertex without contrast.  Comparison: CT 02/07/2005  Findings: Hypodensity in the right insular cortex and right parietal lobe compatible with acute infarct.  There may be mild involvement of the right basal ganglia as well.  This infarct involves approximately one third of  the right MCA territory. Negative for hemorrhage.  No midline shift.  Generalized atrophy of a moderate degree.  No hemorrhage or mass lesion.  Increased density in the distal right middle cerebral artery in the sylvian fissure may represent thrombosis.  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  Critical Value/emergent results were called by telephone at the time of interpretation on 08/24/2011  at 1510 hours  to  Dr. Adriana Simas, who verbally acknowledged these results.  Original Report Authenticated By: Camelia Phenes, M.D.   Mr Brain Wo Contrast  08/25/2011  *RADIOLOGY REPORT*  Clinical Data:  .  Weakness.  Left-sided facial droop.  MRI HEAD WITHOUT CONTRAST MRA HEAD WITHOUT CONTRAST  Technique: Multiplanar, multiecho pulse sequences of the brain and surrounding structures were obtained according to standard protocol without intravenous contrast.  Angiographic images of the head were obtained using MRA technique without contrast.  Comparison: 08/24/2011 head CT.  No comparison MR.  MRI HEAD  Findings:  Motion degraded exam.  Large right middle cerebral artery distribution acute non hemorrhagic infarct.  Mild mass effect upon the right lateral ventricle.  Minimal bowing of the septum to the left.  This infarct extends from the right temporal lobe, involving the right opercular region and right basal ganglia into the right frontal lobe and parietal lobe.  Atrophy without hydrocephalus.  Small vessel disease type changes.  No intracranial mass lesion detected on this unenhanced motion degraded exam.  Abnormal appearance of the right middle cerebral artery.  Please see below.  Mild exophthalmos.  Paranasal sinus minimal to mild mucosal thickening.  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  MRA HEAD  Findings: Exam limited by motion.  This limits the evaluation for grading stenosis or detecting aneurysm.  There is flow within the M1 segment of the right middle cerebral artery however, decreased number of visualized right middle cerebral artery branches consistent with occlusion contributing to the patient's infarct.  Intracranial vasculature appears ectatic with branch vessel narrowing and irregularity suspected but not able to be adequately assessed.  IMPRESSION: Exam limited by motion.  This limits the evaluation for grading stenosis or detecting aneurysm.  There is flow within the M1 segment of the right middle  cerebral artery however, decreased number of visualized right middle cerebral artery branches consistent with occlusion contributing to the patient's infarct.  Original Report Authenticated By: Fuller Canada, M.D.   Mr Mra Head/brain Wo Cm  08/25/2011  *RADIOLOGY REPORT*  Clinical Data:  .  Weakness.  Left-sided facial droop.  MRI HEAD WITHOUT CONTRAST MRA HEAD WITHOUT CONTRAST  Technique: Multiplanar, multiecho pulse sequences of the brain and surrounding structures were obtained according to standard protocol without intravenous contrast.  Angiographic images of the head were obtained using MRA technique without contrast.  Comparison: 08/24/2011 head CT.  No comparison MR.  MRI HEAD  Findings:  Motion degraded exam.  Large right middle cerebral artery distribution acute non hemorrhagic infarct.  Mild mass effect upon the right lateral ventricle.  Minimal bowing of the septum to the left.  This infarct extends from the right temporal lobe, involving the right opercular region and right basal ganglia into the right frontal lobe and parietal lobe.  Atrophy without hydrocephalus.  Small vessel disease type changes.  No intracranial mass lesion detected on this unenhanced motion degraded exam.  Abnormal appearance of the right middle cerebral artery.  Please see below.  Mild exophthalmos.  Paranasal sinus minimal to mild mucosal thickening.  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  MRA HEAD  Findings: Exam limited by motion.  This limits the evaluation for grading stenosis or detecting aneurysm.  There is flow within the M1 segment of the right middle cerebral artery however, decreased number of visualized right middle cerebral artery branches consistent with occlusion contributing to the patient's infarct.  Intracranial vasculature appears ectatic with branch vessel narrowing and irregularity  suspected but not able to be adequately assessed.  IMPRESSION: Exam limited by motion.  This limits the evaluation for grading stenosis or detecting aneurysm.  There is flow within the M1 segment of the right middle cerebral artery however, decreased number of visualized right middle cerebral artery branches consistent with occlusion contributing to the patient's infarct.  Original Report Authenticated By: Fuller Canada, M.D.    Review of Systems  Constitutional: Negative.   HENT: Negative.   Eyes: Negative.   Respiratory: Negative.   Cardiovascular: Negative.   Gastrointestinal: Negative.   Genitourinary: Negative.   Musculoskeletal: Negative.   Skin: Negative.    Blood pressure 169/100, pulse 55, temperature 98.6 F (37 C), temperature source Oral, resp. rate 18, height 5\' 11"  (1.803 m), weight 73.483 kg (162 lb), SpO2 96.00%. Physical Exam  Assessment/Plan: See dict  Sharica Roedel 08/25/2011, 8:18 AM

## 2011-08-25 NOTE — Progress Notes (Signed)
Pt complain of feet pain, observed right ankle and great toe to be red, warm and tender to touch. Pt states he has a hx of gout. Dr. Lendell Caprice made aware. New orders for gout medicine, see MAR.

## 2011-08-25 NOTE — Progress Notes (Signed)
UR chart review completed. Jesus Lin  

## 2011-08-25 NOTE — Consult Note (Signed)
NAMEMELBERT, BOTELHO           ACCOUNT NO.:  [id number]  MEDICAL RECORD NO.:  [id number]  LOCATION:  [id number]                          FACILITY:  [facility]  PHYSICIAN:  Sander Speckman A. Gerilyn Pilgrim, M.D. DATE OF BIRTH:  Jun 20, 1945  DATE OF CONSULTATION: DATE OF DISCHARGE:                                CONSULTATION   The patient is a 66 year old black male who has a longstanding history of alcoholism.  He does not see doctors on a regular basis.  The patient apparently was convinced to seek medical attention because of gait ataxia, left facial weakness, and left-sided weakness.  He was convinced by his family to seek medical attention.  The patient was noted to have hypodensity on CT scan consistent with a right MCA infarct.  This was confirmed by MRI.  MRI also shows evidence of reduced flow in the right MCA artery.  The patient essentially does not take any medications.  The patient himself does not report having any problems today.  He reports that he thinks he is back to normal.  PHYSICAL EXAMINATION:  GENERAL:  Shows a thin, pleasant man in no acute distress. HEENT:  Neck is supple.  Head is normocephalic, atraumatic.  Dentition poor. ABDOMEN:  Soft. EXTREMITIES:  No edema. NEUROLOGIC:  Mentation:  He is awake, alert.  Speech is good.  He is oriented to person, place, year, hospital, month.  Language and cognition are unremarkable.  Cranial nerve evaluation shows the pupils are 4 mm and briskly reactive.  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  Extraocular movements are intact. Facial muscle strength shows flattening in nasolabial fold.  Left tongue is midline.  Uvula midline.  Shoulder shrug is normal.  Motor examination shows mild pronator drift left upper extremity.  Direct strength is actually pretty good.  He has some mild weakness on deltoid 4+, but otherwise has good triceps.  Hand grip is  probably slightly weak also, 4+.  Left leg 4/5.  Right side shows normal tone, bulk, and strength.  Reflexes are brisk in left lower extremity.  Plantars are both extensor.  Upper extremity on right side normal.  Sensation normal to light touch and temperature.  Coordination shows no dysmetria.  No tremors.  No parkinsonism.  ASSESSMENT:  Acute right middle cerebral artery infarct.  Agree with current workup.  He has been placed on aspirin and also thiamine.  He has carotids and echo pending.  Thanks for this consultation.     Siah Steely A. Gerilyn Pilgrim, M.D.     KAD/MEDQ  D:  08/25/2011  T:  08/25/2011  Job:  [id number]

## 2011-08-26 DIAGNOSIS — F1010 Alcohol abuse, uncomplicated: Secondary | ICD-10-CM

## 2011-08-26 DIAGNOSIS — M109 Gout, unspecified: Secondary | ICD-10-CM

## 2011-08-26 DIAGNOSIS — Z720 Tobacco use: Secondary | ICD-10-CM | POA: Diagnosis present

## 2011-08-26 DIAGNOSIS — I6320 Cerebral infarction due to unspecified occlusion or stenosis of unspecified precerebral arteries: Secondary | ICD-10-CM

## 2011-08-26 MED ORDER — SODIUM CHLORIDE 0.9 % IV SOLN
Freq: Once | INTRAVENOUS | Status: AC
  Administered 2011-08-26: 02:00:00 via INTRAVENOUS

## 2011-08-26 MED ORDER — ASPIRIN 325 MG PO TABS: 325.0000 mg | ORAL_TABLET | Freq: Every day | ORAL | Status: DC

## 2011-08-26 NOTE — Progress Notes (Deleted)
Physician Discharge Summary   Patient ID:  Jesus Lin  MRN: [id number]  DOB/AGE: 07/16/1945 65 y.o.  Admit date: 08/24/2011  Discharge date: 08/26/2011  Discharge Diagnoses:  Principal Problem:  *Acute ischemic right MCA stroke  Active Problems:  Alcohol abuse  Acute gout  Tobacco abuse   Medication List  As of 08/26/2011 12:02 PM    TAKE these medications          aspirin 325 MG tablet      Take 1 tablet (325 mg total) by mouth daily.         Discharge Orders    Future Orders  Please Complete By  Expires    Diet general      Walk with assistance      Discharge instructions      Comments:    Avoid alcohol and cigarettes      Disposition: home with home health nursing and physical therapy.  Discharged Condition: Stable  Consults: Treatment Team:  Kofi Doonquah, MD  Labs:  Results for orders placed during the hospital encounter of 08/24/11 (from the past 48 hour(s))   CBC Status: Abnormal    Collection Time    08/24/11 1:16 PM   Component  Value  Range  Comment    WBC  5.8  4.0 - 10.5 (K/uL)     RBC  4.59  4.22 - 5.81 (MIL/uL)     Hemoglobin  13.4  13.0 - 17.0 (g/dL)     HCT  41.4  39.0 - 52.0 (%)     MCV  90.2  78.0 - 100.0 (fL)     MCH  29.2  26.0 - 34.0 (pg)     MCHC  32.4  30.0 - 36.0 (g/dL)     RDW  15.6 (*)  11.5 - 15.5 (%)     Platelets  245  150 - 400 (K/uL)    DIFFERENTIAL Status: Abnormal    Collection Time    08/24/11 1:16 PM   Component  Value  Range  Comment    Neutrophils Relative  77  43 - 77 (%)     Neutro Abs  4.4  1.7 - 7.7 (K/uL)     Lymphocytes Relative  11 (*)  12 - 46 (%)     Lymphs Abs  0.6 (*)  0.7 - 4.0 (K/uL)     Monocytes Relative  11  3 - 12 (%)     Monocytes Absolute  0.6  0.1 - 1.0 (K/uL)     Eosinophils Relative  1  0 - 5 (%)     Eosinophils Absolute  0.1  0.0 - 0.7 (K/uL)     Basophils Relative  0  0 - 1 (%)     Basophils Absolute  0.0  0.0 - 0.1 (K/uL)    BASIC METABOLIC PANEL Status: Abnormal    Collection Time    08/24/11 1:16 PM   Component  Value  Range  Comment    Sodium  141  135 - 145 (mEq/L)     Potassium  3.7  3.5 - 5.1 (mEq/L)     Chloride  102  96 - 112 (mEq/L)     CO2  27  19 - 32 (mEq/L)     Glucose, Bld  102 (*)  70 - 99 (mg/dL)     BUN  8  6 - 23 (mg/dL)     Creatinine, Ser  1.02  0.50 - 1.35 (mg/dL)       Calcium  9.1  8.4 - 10.5 (mg/dL)     GFR calc non Af Amer  76 (*)  >90 (mL/min)     GFR calc Af Amer  88 (*)  >90 (mL/min)    ETHANOL Status: Normal    Collection Time    08/24/11 1:16 PM   Component  Value  Range  Comment    Alcohol, Ethyl (B)  <11  0 - 11 (mg/dL)    POCT I-STAT TROPONIN I Status: Normal    Collection Time    08/24/11 1:31 PM   Component  Value  Range  Comment    Troponin i, poc  0.02  0.00 - 0.08 (ng/mL)     Comment 3      URINALYSIS, ROUTINE W REFLEX MICROSCOPIC Status: Abnormal    Collection Time    08/24/11 2:51 PM   Component  Value  Range  Comment    Color, Urine  YELLOW  YELLOW     APPearance  CLEAR  CLEAR     Specific Gravity, Urine  >1.030 (*)  1.005 - 1.030     pH  6.0  5.0 - 8.0     Glucose, UA  NEGATIVE  NEGATIVE (mg/dL)     Hgb urine dipstick  NEGATIVE  NEGATIVE     Bilirubin Urine  NEGATIVE  NEGATIVE     Ketones, ur  NEGATIVE  NEGATIVE (mg/dL)     Protein, ur  NEGATIVE  NEGATIVE (mg/dL)     Urobilinogen, UA  0.2  0.0 - 1.0 (mg/dL)     Nitrite  NEGATIVE  NEGATIVE     Leukocytes, UA  NEGATIVE  NEGATIVE  MICROSCOPIC NOT DONE ON URINES WITH NEGATIVE PROTEIN, BLOOD, LEUKOCYTES, NITRITE, OR GLUCOSE <1000 mg/dL.   URINE RAPID DRUG SCREEN (HOSP PERFORMED) Status: Normal    Collection Time    08/24/11 2:51 PM   Component  Value  Range  Comment    Opiates  NONE DETECTED  NONE DETECTED     Cocaine  NONE DETECTED  NONE DETECTED     Benzodiazepines  NONE DETECTED  NONE DETECTED     Amphetamines  NONE DETECTED  NONE DETECTED     Tetrahydrocannabinol  NONE DETECTED  NONE DETECTED     Barbiturates  NONE DETECTED  NONE DETECTED    HEMOGLOBIN A1C Status:  Normal    Collection Time    08/25/11 5:25 AM   Component  Value  Range  Comment    Hemoglobin A1C  5.6  <5.7 (%)     Mean Plasma Glucose  114  <117 (mg/dL)    LIPID PANEL Status: Normal    Collection Time    08/25/11 5:31 AM   Component  Value  Range  Comment    Cholesterol  151  0 - 200 (mg/dL)     Triglycerides  82  <150 (mg/dL)     HDL  52  >39 (mg/dL)     Total CHOL/HDL Ratio  2.9      VLDL  16  0 - 40 (mg/dL)     LDL Cholesterol  83  0 - 99 (mg/dL)    URIC ACID Status: Abnormal    Collection Time    08/25/11 5:31 AM   Component  Value  Range  Comment    Uric Acid, Serum  8.9 (*)  4.0 - 7.8 (mg/dL)     Diagnostics: Dg Chest 1 View  08/24/2011 *RADIOLOGY REPORT* Clinical Data: Dyspnea. CHEST - 1 VIEW   Comparison: 02/02/2008. Findings: The heart remains normal in size. The lungs remain clear and hyperexpanded. Tortuous aorta. Mild scoliosis. IMPRESSION: Stable changes of COPD. No acute abnormality. Original Report Authenticated By: STEVEN H. REID, M.D.  Ct Head Wo Contrast  08/24/2011 *RADIOLOGY REPORT* Clinical Data: Syncope last week. Unsteady gait CT HEAD WITHOUT CONTRAST Technique: Contiguous axial images were obtained from the base of the skull through the vertex without contrast. Comparison: CT 02/07/2005 Findings: Hypodensity in the right insular cortex and right parietal lobe compatible with acute infarct. There may be mild involvement of the right basal ganglia as well. This infarct involves approximately one third of the right MCA territory. Negative for hemorrhage. No midline shift. Generalized atrophy of a moderate degree. No hemorrhage or mass lesion. Increased density in the distal right middle cerebral artery in the sylvian fissure may represent thrombosis. 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 Critical Value/emergent results were called by telephone at the time of interpretation on 08/24/2011 at 1510 hours to Dr. Cook, who verbally acknowledged these  results. Original Report Authenticated By: DAVID C. CLARK, M.D.  Mr Brain Wo Contrast  08/25/2011 *RADIOLOGY REPORT* Clinical Data: . Weakness. Left-sided facial droop. MRI HEAD WITHOUT CONTRAST MRA HEAD WITHOUT CONTRAST Technique: Multiplanar, multiecho pulse sequences of the brain and surrounding structures were obtained according to standard protocol without intravenous contrast. Angiographic images of the head were obtained using MRA technique without contrast. Comparison: 08/24/2011 head CT. No comparison MR. MRI HEAD Findings: Motion degraded exam. Large right middle cerebral artery distribution acute non hemorrhagic infarct. Mild mass effect upon the right lateral ventricle. Minimal bowing of the septum to the left. This infarct extends from the right temporal lobe, involving the right opercular region and right basal ganglia into the right frontal lobe and parietal lobe. Atrophy without hydrocephalus. Small vessel disease type changes. No intracranial mass lesion detected on this unenhanced motion degraded exam. Abnormal appearance of the right middle cerebral artery. Please see below. Mild exophthalmos. Paranasal sinus minimal to mild mucosal thickening. 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 MRA HEAD Findings: Exam limited by motion. This limits the evaluation for grading stenosis or detecting aneurysm. There is flow within the M1 segment of the right middle cerebral artery however, decreased number of visualized right middle cerebral artery branches consistent with occlusion contributing to the patient's infarct. Intracranial vasculature appears ectatic with branch vessel narrowing and irregularity suspected but not able to be adequately assessed. IMPRESSION: Exam limited by motion. This limits the evaluation for grading stenosis or detecting aneurysm. There is flow within the M1 segment of  the right middle cerebral artery however, decreased number of visualized right middle cerebral artery branches consistent with occlusion contributing to the patient's infarct. Original Report Authenticated By: STEVEN R. OLSON, M.D.  Us Carotid Duplex Bilateral  08/25/2011 *RADIOLOGY REPORT* Clinical Data: Acute cerebral infarction in the right middle cerebral artery territory. History of hypertension and tobacco use. BILATERAL CAROTID DUPLEX ULTRASOUND Technique: Gray scale imaging, color Doppler and duplex ultrasound was performed of bilateral carotid and vertebral arteries in the neck. Comparison: None. Criteria: Quantification of carotid stenosis is based on velocity parameters that correlate the residual internal carotid diameter with NASCET-based stenosis levels, using the diameter of the distal internal carotid lumen as the denominator for stenosis measurement. The following velocity measurements were obtained: PEAK SYSTOLIC/END DIASTOLIC RIGHT ICA: 90/21cm/sec CCA: 105/10cm/sec SYSTOLIC ICA/CCA RATIO: 0.9 DIASTOLIC ICA/CCA RATIO: 2.1   ECA: 97cm/sec LEFT ICA: 87/19cm/sec CCA: 139/18cm/sec SYSTOLIC ICA/CCA RATIO: 0.6 DIASTOLIC ICA/CCA RATIO: 1.0 ECA: 97cm/sec Findings: RIGHT CAROTID ARTERY: Moderate amount of calcified and noncalcified plaque present beginning at the level of the distal common carotid artery and extending through the bulb and into origins of both internal and external carotid arteries. Plaque surface is irregular with color Doppler showing focal areas of probable plaque ulceration in the proximal internal carotid artery. This may certainly have been a source of the emboli. Estimated right ICA stenosis is less than 50%. RIGHT VERTEBRAL ARTERY: Antegrade flow with normal wave form. LEFT CAROTID ARTERY: Moderate amount of calcified and noncalcified plaque present. Similar to the right side, focal areas of plaque ulceration are suspected. Estimated ICA stenosis is less than 50% based on velocities.  LEFT VERTEBRAL ARTERY: Antegrade flow with normal wave form. IMPRESSION: Significant atherosclerotic plaque at both carotid bifurcations. Although estimated ICA stenoses are less 50% based on velocities, focal areas of plaque ulceration are suspected bilaterally which certainly may be foci for emboli. Original Report Authenticated By: GLENN T. YAMAGATA, M.D.  Mr Mra Head/brain Wo Cm  08/25/2011 *RADIOLOGY REPORT* Clinical Data: . Weakness. Left-sided facial droop. MRI HEAD WITHOUT CONTRAST MRA HEAD WITHOUT CONTRAST Technique: Multiplanar, multiecho pulse sequences of the brain and surrounding structures were obtained according to standard protocol without intravenous contrast. Angiographic images of the head were obtained using MRA technique without contrast. Comparison: 08/24/2011 head CT. No comparison MR. MRI HEAD Findings: Motion degraded exam. Large right middle cerebral artery distribution acute non hemorrhagic infarct. Mild mass effect upon the right lateral ventricle. Minimal bowing of the septum to the left. This infarct extends from the right temporal lobe, involving the right opercular region and right basal ganglia into the right frontal lobe and parietal lobe. Atrophy without hydrocephalus. Small vessel disease type changes. No intracranial mass lesion detected on this unenhanced motion degraded exam. Abnormal appearance of the right middle cerebral artery. Please see below. Mild exophthalmos. Paranasal sinus minimal to mild mucosal thickening. 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 MRA HEAD Findings: Exam limited by motion. This limits the evaluation for grading stenosis or detecting aneurysm. There is flow within the M1 segment of the right middle cerebral artery however, decreased number of visualized right middle cerebral artery branches consistent with occlusion contributing to the  patient's infarct. Intracranial vasculature appears ectatic with branch vessel narrowing and irregularity suspected but not able to be adequately assessed. IMPRESSION: Exam limited by motion. This limits the evaluation for grading stenosis or detecting aneurysm. There is flow within the M1 segment of the right middle cerebral artery however, decreased number of visualized right middle cerebral artery branches consistent with occlusion contributing to the patient's infarct. Original Report Authenticated By: STEVEN R. OLSON, M.D.  Echocardiogram  Left ventricle: The cavity size was normal. Wall thickness was increased in a pattern of mild LVH. Systolic function was normal. The estimated ejection fraction was in the range of 60% to 65%. Wall motion was normal; there were no regional wall motion abnormalities. Doppler parameters are consistent with abnormal left ventricular relaxation (grade 1 diastolic dysfunction). - Mitral valve: Calcified annulus. Trivial regurgitation. - Tricuspid valve: Mild regurgitation. Peak gradient: 32mm Hg (D). - Pericardium, extracardiac: There was no pericardial effusion.  Procedures: None  EKG: Normal sinus rhythm  Left axis deviation  Septal infarct (cited on or before 02-Feb-2008)  Hospital Course:  See H&P for complete   admission details. Mr. Mcgraw is a 65-year-old unassigned black male who was brought to the emergency room by family members with difficulty walking. His symptoms had actually started several days prior to admission and started out with slurred speech, ataxia, drooling, facial droop, and multiple falls. Patient is an alcoholic and really had no complaints himself. He had no previous history of stroke. He smokes cigarettes. He has a previous history of gout but otherwise no medical problems. He had some slight left-sided weakness and homonomous hemianopsia on the left. Mild left facial droop. Difficulty with left finger to nose. He had no problems  swallowing and his speech was clear and fluent. CAT scan showed acute right middle cerebral artery infarct. Because of his delayed presentation, he was not a candidate for TPA. There was suspicion of MCA thrombus on noncontrasted CAT scan. Because patient's symptoms were improving and his presentation with subacute, as well as his large stroke territory, anticoagulation was not started. He would be at risk for hemorrhagic inversion and he also is a fall risk and we were concerned about alcohol withdrawal. Ultrasound of the carotids showed less than 50% stenosis but probable ulcerated plaque which was the likely embolic source. Patient has not developed any alcohol withdrawal but he was placed on thiamine multivitamin folate and Ativan protocol. He does not wish to quit drinking or smoking. I've placed him on an aspirin and encouraged him to quit both. Neurology was consulted and agrees with management. Eyes discussed plans with family. They will stay with him temporarily. I've also arranged home health nursing and physical therapy. His symptoms actually have improved quite a bit while here and he is able to ambulate today. His coordination on the left is improved.  He did develop acute gout of the left MTP joint and right foot which responded quickly to colchicine and a few doses of indomethacin.  Family wishes to have the patient followup with Dr. Fanta who apparently takes care of several other siblings.  Total time on the day of discharge greater than 30 minutes.  Discharge Exam:  Blood pressure 158/83, pulse 56, temperature 98 F (36.7 C), temperature source Oral, resp. rate 20, height 5' 11" (1.803 m), weight 73.483 kg (162 lb), SpO2 97.00%.  General: Alert and oriented.  Lungs clear to auscultation bilaterally without wheeze rhonchi or rales  Cardiovascular regular rate rhythm without murmurs gallops rubs  Abdomen soft nontender nondistended  Extremities previous inflamed joints without erythema  tenderness or induration.  Neurologic: Finger to nose and left-sided strength improved. Patient was noted ambulating with physical therapy earlier today.  Signed:  Kynnedy Carreno L  08/26/2011, 12:02 PM    nontender nondistended Extremities previous inflamed joints without erythema tenderness or induration. Neurologic: Finger to nose and left-sided strength improved. Patient was noted ambulating with physical therapy earlier today.   SignedChristiane Ha 08/26/2011, 12:02 PM

## 2011-08-26 NOTE — Discharge Summary (Signed)
Physician Discharge Summary   Patient ID:  Jesus Lin  MRN: [id number]  DOB/AGE: 08-10-1945 66 y.o.  Admit date: 08/24/2011  Discharge date: 08/26/2011  Discharge Diagnoses:  Principal Problem:  *Acute ischemic right MCA stroke  Active Problems:  Alcohol abuse  Acute gout  Tobacco abuse   Medication List  As of 08/26/2011 12:02 PM    TAKE these medications          aspirin 325 MG tablet      Take 1 tablet (325 mg total) by mouth daily.         Discharge Orders    Future Orders  Please Complete By  Expires    Diet general      Walk with assistance      Discharge instructions      Comments:    Avoid alcohol and cigarettes      Disposition: home with home health nursing and physical therapy.  Discharged Condition: Stable  Consults: Treatment Team:  Beryle Beams, MD  Labs:  Results for orders placed during the hospital encounter of 08/24/11 (from the past 48 hour(s))   CBC Status: Abnormal    Collection Time    08/24/11 1:16 PM   Component  Value  Range  Comment    WBC  5.8  4.0 - 10.5 (K/uL)     RBC  4.59  4.22 - 5.81 (MIL/uL)     Hemoglobin  13.4  13.0 - 17.0 (g/dL)     HCT  41.3  24.4 - 52.0 (%)     MCV  90.2  78.0 - 100.0 (fL)     MCH  29.2  26.0 - 34.0 (pg)     MCHC  32.4  30.0 - 36.0 (g/dL)     RDW  01.0 (*)  27.2 - 15.5 (%)     Platelets  245  150 - 400 (K/uL)    DIFFERENTIAL Status: Abnormal    Collection Time    08/24/11 1:16 PM   Component  Value  Range  Comment    Neutrophils Relative  77  43 - 77 (%)     Neutro Abs  4.4  1.7 - 7.7 (K/uL)     Lymphocytes Relative  11 (*)  12 - 46 (%)     Lymphs Abs  0.6 (*)  0.7 - 4.0 (K/uL)     Monocytes Relative  11  3 - 12 (%)     Monocytes Absolute  0.6  0.1 - 1.0 (K/uL)     Eosinophils Relative  1  0 - 5 (%)     Eosinophils Absolute  0.1  0.0 - 0.7 (K/uL)     Basophils Relative  0  0 - 1 (%)     Basophils Absolute  0.0  0.0 - 0.1 (K/uL)    BASIC METABOLIC PANEL Status: Abnormal    Collection Time    08/24/11 1:16 PM   Component  Value  Range  Comment    Sodium  141  135 - 145 (mEq/L)     Potassium  3.7  3.5 - 5.1 (mEq/L)     Chloride  102  96 - 112 (mEq/L)     CO2  27  19 - 32 (mEq/L)     Glucose, Bld  102 (*)  70 - 99 (mg/dL)     BUN  8  6 - 23 (mg/dL)     Creatinine, Ser  5.36  0.50 - 1.35 (mg/dL)  Calcium  9.1  8.4 - 10.5 (mg/dL)     GFR calc non Af Amer  76 (*)  >90 (mL/min)     GFR calc Af Amer  88 (*)  >90 (mL/min)    ETHANOL Status: Normal    Collection Time    08/24/11 1:16 PM   Component  Value  Range  Comment    Alcohol, Ethyl (B)  <11  0 - 11 (mg/dL)    POCT I-STAT TROPONIN I Status: Normal    Collection Time    08/24/11 1:31 PM   Component  Value  Range  Comment    Troponin i, poc  0.02  0.00 - 0.08 (ng/mL)     Comment 3      URINALYSIS, ROUTINE W REFLEX MICROSCOPIC Status: Abnormal    Collection Time    08/24/11 2:51 PM   Component  Value  Range  Comment    Color, Urine  YELLOW  YELLOW     APPearance  CLEAR  CLEAR     Specific Gravity, Urine  >1.030 (*)  1.005 - 1.030     pH  6.0  5.0 - 8.0     Glucose, UA  NEGATIVE  NEGATIVE (mg/dL)     Hgb urine dipstick  NEGATIVE  NEGATIVE     Bilirubin Urine  NEGATIVE  NEGATIVE     Ketones, ur  NEGATIVE  NEGATIVE (mg/dL)     Protein, ur  NEGATIVE  NEGATIVE (mg/dL)     Urobilinogen, UA  0.2  0.0 - 1.0 (mg/dL)     Nitrite  NEGATIVE  NEGATIVE     Leukocytes, UA  NEGATIVE  NEGATIVE  MICROSCOPIC NOT DONE ON URINES WITH NEGATIVE PROTEIN, BLOOD, LEUKOCYTES, NITRITE, OR GLUCOSE <1000 mg/dL.   URINE RAPID DRUG SCREEN (HOSP PERFORMED) Status: Normal    Collection Time    08/24/11 2:51 PM   Component  Value  Range  Comment    Opiates  NONE DETECTED  NONE DETECTED     Cocaine  NONE DETECTED  NONE DETECTED     Benzodiazepines  NONE DETECTED  NONE DETECTED     Amphetamines  NONE DETECTED  NONE DETECTED     Tetrahydrocannabinol  NONE DETECTED  NONE DETECTED     Barbiturates  NONE DETECTED  NONE DETECTED    HEMOGLOBIN A1C Status:  Normal    Collection Time    08/25/11 5:25 AM   Component  Value  Range  Comment    Hemoglobin A1C  5.6  <5.7 (%)     Mean Plasma Glucose  114  <117 (mg/dL)    LIPID PANEL Status: Normal    Collection Time    08/25/11 5:31 AM   Component  Value  Range  Comment    Cholesterol  151  0 - 200 (mg/dL)     Triglycerides  82  <150 (mg/dL)     HDL  52  >41 (mg/dL)     Total CHOL/HDL Ratio  2.9      VLDL  16  0 - 40 (mg/dL)     LDL Cholesterol  83  0 - 99 (mg/dL)    URIC ACID Status: Abnormal    Collection Time    08/25/11 5:31 AM   Component  Value  Range  Comment    Uric Acid, Serum  8.9 (*)  4.0 - 7.8 (mg/dL)     Diagnostics: Dg Chest 1 View  08/24/2011 *RADIOLOGY REPORT* Clinical Data: Dyspnea. CHEST - 1 VIEW  Comparison: 02/02/2008. Findings: The heart remains normal in size. The lungs remain clear and hyperexpanded. Tortuous aorta. Mild scoliosis. IMPRESSION: Stable changes of COPD. No acute abnormality. Original Report Authenticated By: Darrol Angel, M.D.  Ct Head Wo Contrast  08/24/2011 *RADIOLOGY REPORT* Clinical Data: Syncope last week. Unsteady gait CT HEAD WITHOUT CONTRAST Technique: Contiguous axial images were obtained from the base of the skull through the vertex without contrast. Comparison: CT 02/07/2005 Findings: Hypodensity in the right insular cortex and right parietal lobe compatible with acute infarct. There may be mild involvement of the right basal ganglia as well. This infarct involves approximately one third of the right MCA territory. Negative for hemorrhage. No midline shift. Generalized atrophy of a moderate degree. No hemorrhage or mass lesion. Increased density in the distal right middle cerebral artery in the sylvian fissure may represent thrombosis. 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 Critical Value/emergent results were called by telephone at the time of interpretation on 08/24/2011 at 1510 hours to Dr. Adriana Simas, who verbally acknowledged these  results. Original Report Authenticated By: Camelia Phenes, M.D.  Mr Brain Wo Contrast  08/25/2011 *RADIOLOGY REPORT* Clinical Data: . Weakness. Left-sided facial droop. MRI HEAD WITHOUT CONTRAST MRA HEAD WITHOUT CONTRAST Technique: Multiplanar, multiecho pulse sequences of the brain and surrounding structures were obtained according to standard protocol without intravenous contrast. Angiographic images of the head were obtained using MRA technique without contrast. Comparison: 08/24/2011 head CT. No comparison MR. MRI HEAD Findings: Motion degraded exam. Large right middle cerebral artery distribution acute non hemorrhagic infarct. Mild mass effect upon the right lateral ventricle. Minimal bowing of the septum to the left. This infarct extends from the right temporal lobe, involving the right opercular region and right basal ganglia into the right frontal lobe and parietal lobe. Atrophy without hydrocephalus. Small vessel disease type changes. No intracranial mass lesion detected on this unenhanced motion degraded exam. Abnormal appearance of the right middle cerebral artery. Please see below. Mild exophthalmos. Paranasal sinus minimal to mild mucosal thickening. 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 MRA HEAD Findings: Exam limited by motion. This limits the evaluation for grading stenosis or detecting aneurysm. There is flow within the M1 segment of the right middle cerebral artery however, decreased number of visualized right middle cerebral artery branches consistent with occlusion contributing to the patient's infarct. Intracranial vasculature appears ectatic with branch vessel narrowing and irregularity suspected but not able to be adequately assessed. IMPRESSION: Exam limited by motion. This limits the evaluation for grading stenosis or detecting aneurysm. There is flow within the M1 segment of  the right middle cerebral artery however, decreased number of visualized right middle cerebral artery branches consistent with occlusion contributing to the patient's infarct. Original Report Authenticated By: Fuller Canada, M.D.  US Carotid Duplex Bilateral  08/25/2011 *RADIOLOGY REPORT* Clinical Data: Acute cerebral infarction in the right middle cerebral artery territory. History of hypertension and tobacco use. BILATERAL CAROTID DUPLEX ULTRASOUND Technique: Wallace Cullens scale imaging, color Doppler and duplex ultrasound was performed of bilateral carotid and vertebral arteries in the neck. Comparison: None. Criteria: Quantification of carotid stenosis is based on velocity parameters that correlate the residual internal carotid diameter with NASCET-based stenosis levels, using the diameter of the distal internal carotid lumen as the denominator for stenosis measurement. The following velocity measurements were obtained: PEAK SYSTOLIC/END DIASTOLIC RIGHT ICA: 90/21cm/sec CCA: 105/10cm/sec SYSTOLIC ICA/CCA RATIO: 0.9 DIASTOLIC ICA/CCA RATIO: 2.1  ECA: 97cm/sec LEFT ICA: 87/19cm/sec CCA: 139/18cm/sec SYSTOLIC ICA/CCA RATIO: 0.6 DIASTOLIC ICA/CCA RATIO: 1.0 ECA: 97cm/sec Findings: RIGHT CAROTID ARTERY: Moderate amount of calcified and noncalcified plaque present beginning at the level of the distal common carotid artery and extending through the bulb and into origins of both internal and external carotid arteries. Plaque surface is irregular with color Doppler showing focal areas of probable plaque ulceration in the proximal internal carotid artery. This may certainly have been a source of the emboli. Estimated right ICA stenosis is less than 50%. RIGHT VERTEBRAL ARTERY: Antegrade flow with normal wave form. LEFT CAROTID ARTERY: Moderate amount of calcified and noncalcified plaque present. Similar to the right side, focal areas of plaque ulceration are suspected. Estimated ICA stenosis is less than 50% based on velocities.  LEFT VERTEBRAL ARTERY: Antegrade flow with normal wave form. IMPRESSION: Significant atherosclerotic plaque at both carotid bifurcations. Although estimated ICA stenoses are less 50% based on velocities, focal areas of plaque ulceration are suspected bilaterally which certainly may be foci for emboli. Original Report Authenticated By: Reola Calkins, M.D.  Mr Mra Head/brain Wo Cm  08/25/2011 *RADIOLOGY REPORT* Clinical Data: . Weakness. Left-sided facial droop. MRI HEAD WITHOUT CONTRAST MRA HEAD WITHOUT CONTRAST Technique: Multiplanar, multiecho pulse sequences of the brain and surrounding structures were obtained according to standard protocol without intravenous contrast. Angiographic images of the head were obtained using MRA technique without contrast. Comparison: 08/24/2011 head CT. No comparison MR. MRI HEAD Findings: Motion degraded exam. Large right middle cerebral artery distribution acute non hemorrhagic infarct. Mild mass effect upon the right lateral ventricle. Minimal bowing of the septum to the left. This infarct extends from the right temporal lobe, involving the right opercular region and right basal ganglia into the right frontal lobe and parietal lobe. Atrophy without hydrocephalus. Small vessel disease type changes. No intracranial mass lesion detected on this unenhanced motion degraded exam. Abnormal appearance of the right middle cerebral artery. Please see below. Mild exophthalmos. Paranasal sinus minimal to mild mucosal thickening. 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 MRA HEAD Findings: Exam limited by motion. This limits the evaluation for grading stenosis or detecting aneurysm. There is flow within the M1 segment of the right middle cerebral artery however, decreased number of visualized right middle cerebral artery branches consistent with occlusion contributing to the  patient's infarct. Intracranial vasculature appears ectatic with branch vessel narrowing and irregularity suspected but not able to be adequately assessed. IMPRESSION: Exam limited by motion. This limits the evaluation for grading stenosis or detecting aneurysm. There is flow within the M1 segment of the right middle cerebral artery however, decreased number of visualized right middle cerebral artery branches consistent with occlusion contributing to the patient's infarct. Original Report Authenticated By: Fuller Canada, M.D.  Echocardiogram  Left ventricle: The cavity size was normal. Wall thickness was increased in a pattern of mild LVH. Systolic function was normal. The estimated ejection fraction was in the range of 60% to 65%. Wall motion was normal; there were no regional wall motion abnormalities. Doppler parameters are consistent with abnormal left ventricular relaxation (grade 1 diastolic dysfunction). - Mitral valve: Calcified annulus. Trivial regurgitation. - Tricuspid valve: Mild regurgitation. Peak gradient: 32mm Hg (D). - Pericardium, extracardiac: There was no pericardial effusion.  Procedures: None  EKG: Normal sinus rhythm  Left axis deviation  Septal infarct (cited on or before 02-Feb-2008)  Hospital Course:  See H&P for complete  admission details. Mr. Duve is a 67 year old unassigned black male who was brought to the emergency room by family members with difficulty walking. His symptoms had actually started several days prior to admission and started out with slurred speech, ataxia, drooling, facial droop, and multiple falls. Patient is an alcoholic and really had no complaints himself. He had no previous history of stroke. He smokes cigarettes. He has a previous history of gout but otherwise no medical problems. He had some slight left-sided weakness and homonomous hemianopsia on the left. Mild left facial droop. Difficulty with left finger to nose. He had no problems  swallowing and his speech was clear and fluent. CAT scan showed acute right middle cerebral artery infarct. Because of his delayed presentation, he was not a candidate for TPA. There was suspicion of MCA thrombus on noncontrasted CAT scan. Because patient's symptoms were improving and his presentation with subacute, as well as his large stroke territory, anticoagulation was not started. He would be at risk for hemorrhagic inversion and he also is a fall risk and we were concerned about alcohol withdrawal. Ultrasound of the carotids showed less than 50% stenosis but probable ulcerated plaque which was the likely embolic source. Patient has not developed any alcohol withdrawal but he was placed on thiamine multivitamin folate and Ativan protocol. He does not wish to quit drinking or smoking. I've placed him on an aspirin and encouraged him to quit both. Neurology was consulted and agrees with management. Eyes discussed plans with family. They will stay with him temporarily. I've also arranged home health nursing and physical therapy. His symptoms actually have improved quite a bit while here and he is able to ambulate today. His coordination on the left is improved.  He did develop acute gout of the left MTP joint and right foot which responded quickly to colchicine and a few doses of indomethacin.  Family wishes to have the patient followup with Dr. Felecia Shelling who apparently takes care of several other siblings.  Total time on the day of discharge greater than 30 minutes.  Discharge Exam:  Blood pressure 158/83, pulse 56, temperature 98 F (36.7 C), temperature source Oral, resp. rate 20, height 5\' 11"  (1.803 m), weight 73.483 kg (162 lb), SpO2 97.00%.  General: Alert and oriented.  Lungs clear to auscultation bilaterally without wheeze rhonchi or rales  Cardiovascular regular rate rhythm without murmurs gallops rubs  Abdomen soft nontender nondistended  Extremities previous inflamed joints without erythema  tenderness or induration.  Neurologic: Finger to nose and left-sided strength improved. Patient was noted ambulating with physical therapy earlier today.  SignedChristiane Ha  08/26/2011, 12:02 PM

## 2011-08-26 NOTE — Progress Notes (Signed)
Patient discharged home with family.  Home health with RN an PT to start following this coming week.  [facility] called and faxed info over to set up.  Patient instructed to take ASA daily. No other changes to medications made.  Instructed to make follow up appointment with PCP.  Strongly encouraged patient to avoid use of alcohol and smoking.  Patient states "he'll try".  Education given.  Patient or family have no questions at this time.

## 2011-08-26 NOTE — Discharge Instructions (Signed)
Stop drinking alcohol. Followup your primary care Dr.      B[place] were normal.Stroke (Cerebrovascular Accident) A stroke (cerebrovascular accident, CVA) means you have a brain injury from blocked circulation or bleeding in the brain. Blocked circulation usually comes from a clot. RISK FACTORS  High blood pressure (hypertension).   High cholesterol.   Diabetes.   Heart disease.   The buildup of fatty deposits in the blood vessels (peripheral artery disease or atherosclerosis).   An abnormal heart rhythm (atrial fibrillation).   Obesity.   Smoking.   Taking oral contraceptives (especially in combination with smoking).   Physical inactivity.   A diet high in fats, salt (sodium), and calories.   Alcohol use.   Use of illegal drugs (especially cocaine and methamphetamine).   Being a male.   Being an Tree surgeon.   Age over 50.   Family history of stroke.   Previous history of blood clots, a "warning stroke" (transient ischemic attack, TIA), or heart attack.   Sickle cell disease.  SYMPTOMS  The symptoms of a stroke depend on the part of the brain that is affected. It is important to seek treatment within 4 hours of the start of symptoms because you may receive a "clot dissolving" medication that cannot be given after that time. Even if you don't know when your symptoms began, get treatment as soon as possible. Symptoms of a stroke may progress or change over the first several days. Symptoms may include:  Sudden weakness or numbness of the face, arm, or leg, especially on one side of the body.   Sudden confusion.   Trouble speaking (aphasia) or understanding.   Sudden trouble seeing in one or both eyes.   Sudden trouble walking.   Dizziness.   Loss of balance or coordination.   Sudden severe headache with no known cause.  HOME CARE INSTRUCTIONS   Medicines: Aspirin and blood thinners may be used to prevent another stroke. Blood thinners need to be used  exactly as instructed. Medicines may also be used to control risk factors for a stroke. Be sure you understand all your medicine instructions.   Diet: Certain diets may be prescribed to address high blood pressure, high cholesterol, diabetes, or obesity. A diet that includes 5 or more servings of fruits and vegetables a day may reduce the risk of stroke. Foods may need to be a special consistency (soft or pureed), or small bites may need to be taken in order to avoid aspirating or choking.   Maintain a healthy weight.   Stay physically active. It is recommended that you get at least 30 minutes of activity on most or all days.   Do not smoke.   Limit alcohol use.   Stop drug abuse.   Home safety: A safe home environment is important to reduce the risk of falls. Your caregiver may arrange for specialists to evaluate your home. Having grab bars in the bedroom and bathroom is often important. Your caregiver may arrange for special equipment to be used at home, such as raised toilets and a seat for the shower.   Physical, occupational, and speech therapy: Ongoing therapy may be needed to maximize your recovery after a stroke. If you have been advised to use a walker or a cane, use it at all times. Be sure to keep your therapy appointments.   Follow all instructions for follow-up with your caregiver. This is VERY important. This includes any referrals, physical therapy, rehabilitation, and laboratory tests. Proper treatment also  prevents another stroke from occurring.  SEEK IMMEDIATE MEDICAL CARE IF:   You have sudden weakness or numbness of the face, arm, or leg, especially on one side of the body.   You have sudden confusion.   You have trouble speaking (aphasia) or understanding.   You have sudden trouble seeing in one or both eyes.   You have sudden trouble walking.   You have dizziness.   You have loss of balance or coordination.   You have a sudden, severe headache with no known  cause.   You have a fever.   You are coughing or have difficulty breathing.   You have new chest pain, angina, or an irregular heartbeat.  Any of these symptoms may represent a serious problem that is an emergency. Do not wait to see if the symptoms will go away. Get medical help at once. Call your local emergency services (911 in [place] Do not drive yourself to the hospital. Document Released: 03/20/2005 Document Revised: 10/03/2010 Document Reviewed: 08/18/2009 [facility] Patient Information 2012 [place], [place].

## 2011-08-26 NOTE — Progress Notes (Signed)
Physical Therapy Treatment Patient Details Name: Jesus Lin MRN: [id number] DOB: 08-07-1945 Today's Date: 08/26/2011 Time: 1050-1120 PT Time Calculation (min): 30 min  PT Assessment / Plan / Recommendation Comments on Treatment Session  Patient was able to actively participate in therapy today with no c/o pain on R ankle and foot. Pt generally is at Supervision during mobility using a RW and requires occasional cueing for safety during transfers and ambulation. Pt will benefit from HHPT for home safety and gait training activities.      Follow Up Recommendations  Home health PT    Barriers to Discharge        Equipment Recommendations  Rolling walker with 5" wheels    Recommendations for Other Services    Frequency Min 5X/week   Plan Discharge plan remains appropriate    Precautions / Restrictions Precautions Precautions: Fall Restrictions Weight Bearing Restrictions: No        Mobility  Bed Mobility Bed Mobility: Rolling Right Rolling Right: 7: Independent Supine to Sit: 7: Independent Sit to Supine: 7: Independent Transfers Transfers: Sit to Stand;Stand to Sit;Stand Pivot Transfers Sit to Stand: 6: Modified independent (Device/Increase time) Stand to Sit: 6: Modified independent (Device/Increase time) Stand Pivot Transfers: 5: Supervision Details for Transfer Assistance: requires supervision secondary to decreased safety awareness (may have tendency to be impulsive) placing patient at risk for falls  Ambulation/Gait Ambulation/Gait Assistance: 5: Supervision Ambulation Distance (Feet): 100 Feet Assistive device: Rolling walker Ambulation/Gait Assistance Details: requires Supervision with occasional cueing for proper gait sequencing; noted instability during turning activities  Gait Pattern: Decreased step length - left;Decreased step length - right;Decreased stride length;Ataxic Stairs: Yes Stairs Assistance: 4: Min guard Stairs Assistance Details (indicate  cue type and reason): cues for safety (proper sequencing and turning and use of handrail) Stair Management Technique: One rail Right;One rail Left Number of Stairs: 5     Exercises General Exercises - Lower Extremity Hip ABduction/ADduction: AROM;Both;10 reps;Standing Hip Flexion/Marching: AROM;20 reps;Standing Mini-Sqauts: AROM;20 reps;Standing   PT Diagnosis:    PT Problem List:   PT Treatment Interventions:     PT Goals Acute Rehab PT Goals PT Goal Formulation: With patient Potential to Achieve Goals: Good Pt will Ambulate: >150 feet;with modified independence PT Goal: Ambulate - Progress: Goal set today Pt will Go Up / Down Stairs: 3-5 stairs;with modified independence PT Goal: Up/Down Stairs - Progress: Goal set today  Visit Information       Subjective Data  Subjective: no complaint of pain during session  Patient Stated Goal: return home    Cognition  Overall Cognitive Status: Appears within functional limits for tasks assessed/performed Arousal/Alertness: Awake/alert Orientation Level: Appears intact for tasks assessed Behavior During Session: [facility] for tasks performed    Balance  Balance Balance Assessed: Yes Dynamic Sitting Balance Dynamic Sitting - Balance Support: No upper extremity supported;Feet supported;During functional activity Dynamic Sitting - Level of Assistance: 6: Modified independent (Device/Increase time) Dynamic Sitting - Balance Activities: Reaching across midline;Reaching for objects;Lateral lean/weight shifting Static Standing Balance Static Standing - Balance Support: No upper extremity supported Static Standing - Level of Assistance: 5: Stand by assistance Dynamic Standing Balance Dynamic Standing - Balance Support: During functional activity;Bilateral upper extremity supported Dynamic Standing - Level of Assistance: 5: Stand by assistance Dynamic Standing - Balance Activities: Forward lean/weight shifting  End of Session PT - End of  Session Equipment Utilized During Treatment: Gait belt Activity Tolerance: Patient tolerated treatment well Patient left: in bed;with call bell/phone within reach;with bed alarm  set    Leevon Upperman Cresenciano Genre 08/26/2011, 12:05 PM

## 2011-09-01 ENCOUNTER — Encounter (HOSPITAL_COMMUNITY): Payer: Self-pay

## 2011-09-01 ENCOUNTER — Emergency Department (HOSPITAL_COMMUNITY): Payer: Medicare Other

## 2011-09-01 ENCOUNTER — Emergency Department (HOSPITAL_COMMUNITY)
Admission: EM | Admit: 2011-09-01 | Discharge: 2011-09-01 | Disposition: A | Discharge status: Home / Self Care | Payer: Medicare Other | Attending: Emergency Medicine | Admitting: Emergency Medicine

## 2011-09-01 DIAGNOSIS — Z8673 Personal history of transient ischemic attack (TIA), and cerebral infarction without residual deficits: Secondary | ICD-10-CM | POA: Insufficient documentation

## 2011-09-01 DIAGNOSIS — J449 Chronic obstructive pulmonary disease, unspecified: Secondary | ICD-10-CM | POA: Insufficient documentation

## 2011-09-01 DIAGNOSIS — I10 Essential (primary) hypertension: Secondary | ICD-10-CM

## 2011-09-01 DIAGNOSIS — J4489 Other specified chronic obstructive pulmonary disease: Secondary | ICD-10-CM | POA: Insufficient documentation

## 2011-09-01 DIAGNOSIS — F17200 Nicotine dependence, unspecified, uncomplicated: Secondary | ICD-10-CM | POA: Insufficient documentation

## 2011-09-01 HISTORY — DX: Cerebral infarction, unspecified: I63.9

## 2011-09-01 LAB — URINALYSIS, ROUTINE W REFLEX MICROSCOPIC
Glucose, UA: NEGATIVE mg/dL
Ketones, ur: NEGATIVE mg/dL
Leukocytes, UA: NEGATIVE
Nitrite: NEGATIVE
Protein, ur: NEGATIVE mg/dL
pH: 6 (ref 5.0–8.0)

## 2011-09-01 LAB — BASIC METABOLIC PANEL
CO2: 28 mEq/L (ref 19–32)
Chloride: 101 mEq/L (ref 96–112)
Creatinine, Ser: 0.89 mg/dL (ref 0.50–1.35)
GFR calc Af Amer: >90 mL/min (ref >90)
Sodium: 138 mEq/L (ref 135–145)

## 2011-09-01 MED ORDER — LISINOPRIL 10 MG PO TABS: 10.0000 mg | ORAL_TABLET | Freq: Every day | ORAL | Status: DC

## 2011-09-01 MED ORDER — HYDROCHLOROTHIAZIDE 25 MG PO TABS
25.0000 mg | ORAL_TABLET | Freq: Every day | ORAL | Status: DC
  Administered 2011-09-01: 25 mg via ORAL
  Filled 2011-09-01 (×2): qty 1

## 2011-09-01 NOTE — Discharge Instructions (Signed)
Arterial Hypertension Arterial hypertension (high blood pressure) is a condition of elevated pressure in your blood vessels. Hypertension over a long period of time is a risk factor for strokes, heart attacks, and heart failure. It is also the leading cause of kidney (renal) failure.  CAUSES   In Adults -- Over 90% of all hypertension has no known cause. This is called essential or primary hypertension. In the other 10% of people with hypertension, the increase in blood pressure is caused by another disorder. This is called secondary hypertension. Important causes of secondary hypertension are:   Heavy alcohol use.   Obstructive sleep apnea.   Hyperaldosterosim (Conn's syndrome).   Steroid use.   Chronic kidney failure.   Hyperparathyroidism.   Medications.   Renal artery stenosis.   Pheochromocytoma.   Cushing's disease.   Coarctation of the aorta.   Scleroderma renal crisis.   Licorice (in excessive amounts).   Drugs (cocaine, methamphetamine).  Your caregiver can explain any items above that apply to you.  In Children -- Secondary hypertension is more common and should always be considered.   Pregnancy -- Few women of childbearing age have high blood pressure. However, up to 10% of them develop hypertension of pregnancy. Generally, this will not harm the woman. It may be a sign of 3 complications of pregnancy: preeclampsia, HELLP syndrome, and eclampsia. Follow up and control with medication is necessary.  SYMPTOMS   This condition normally does not produce any noticeable symptoms. It is usually found during a routine exam.   Malignant hypertension is a late problem of high blood pressure. It may have the following symptoms:   Headaches.   Blurred vision.   End-organ damage (this means your kidneys, heart, lungs, and other organs are being damaged).   Stressful situations can increase the blood pressure. If a person with normal blood pressure has their blood  pressure go up while being seen by their caregiver, this is often termed "white coat hypertension." Its importance is not known. It may be related with eventually developing hypertension or complications of hypertension.   Hypertension is often confused with mental tension, stress, and anxiety.  DIAGNOSIS  The diagnosis is made by 3 separate blood pressure measurements. They are taken at least 1 week apart from each other. If there is organ damage from hypertension, the diagnosis may be made without repeat measurements. Hypertension is usually identified by having blood pressure readings:  Above 140/90 mmHg measured in both arms, at 3 separate times, over a couple weeks.   Over 130/80 mmHg should be considered a risk factor and may require treatment in patients with diabetes.  Blood pressure readings over 120/80 mmHg are called "pre-hypertension" even in non-diabetic patients. To get a true blood pressure measurement, use the following guidelines. Be aware of the factors that can alter blood pressure readings.  Take measurements at least 1 hour after caffeine.   Take measurements 30 minutes after smoking and without any stress. This is another reason to quit smoking - it raises your blood pressure.   Use a proper cuff size. Ask your caregiver if you are not sure about your cuff size.   Most home blood pressure cuffs are automatic. They will measure systolic and diastolic pressures. The systolic pressure is the pressure reading at the start of sounds. Diastolic pressure is the pressure at which the sounds disappear. If you are elderly, measure pressures in multiple postures. Try sitting, lying or standing.   Sit at rest for a minimum of   5 minutes before taking measurements.   You should not be on any medications like decongestants. These are found in many cold medications.   Record your blood pressure readings and review them with your caregiver.  If you have hypertension:  Your caregiver  may do tests to be sure you do not have secondary hypertension (see "causes" above).   Your caregiver may also look for signs of metabolic syndrome. This is also called Syndrome X or Insulin Resistance Syndrome. You may have this syndrome if you have type 2 diabetes, abdominal obesity, and abnormal blood lipids in addition to hypertension.   Your caregiver will take your medical and family history and perform a physical exam.   Diagnostic tests may include blood tests (for glucose, cholesterol, potassium, and kidney function), a urinalysis, or an EKG. Other tests may also be necessary depending on your condition.  PREVENTION  There are important lifestyle issues that you can adopt to reduce your chance of developing hypertension:  Maintain a normal weight.   Limit the amount of salt (sodium) in your diet.   Exercise often.   Limit alcohol intake.   Get enough potassium in your diet. Discuss specific advice with your caregiver.   Follow a DASH diet (dietary approaches to stop hypertension). This diet is rich in fruits, vegetables, and low-fat dairy products, and avoids certain fats.  PROGNOSIS  Essential hypertension cannot be cured. Lifestyle changes and medical treatment can lower blood pressure and reduce complications. The prognosis of secondary hypertension depends on the underlying cause. Many people whose hypertension is controlled with medicine or lifestyle changes can live a normal, healthy life.  RISKS AND COMPLICATIONS  While high blood pressure alone is not an illness, it often requires treatment due to its short- and long-term effects on many organs. Hypertension increases your risk for:  CVAs or strokes (cerebrovascular accident).   Heart failure due to chronically high blood pressure (hypertensive cardiomyopathy).   Heart attack (myocardial infarction).   Damage to the retina (hypertensive retinopathy).   Kidney failure (hypertensive nephropathy).  Your caregiver can  explain list items above that apply to you. Treatment of hypertension can significantly reduce the risk of complications. TREATMENT   For overweight patients, weight loss and regular exercise are recommended. Physical fitness lowers blood pressure.   Mild hypertension is usually treated with diet and exercise. A diet rich in fruits and vegetables, fat-free dairy products, and foods low in fat and salt (sodium) can help lower blood pressure. Decreasing salt intake decreases blood pressure in a 1/3 of people.   Stop smoking if you are a smoker.  The steps above are highly effective in reducing blood pressure. While these actions are easy to suggest, they are difficult to achieve. Most patients with moderate or severe hypertension end up requiring medications to bring their blood pressure down to a normal level. There are several classes of medications for treatment. Blood pressure pills (antihypertensives) will lower blood pressure by their different actions. Lowering the blood pressure by 10 mmHg may decrease the risk of complications by as much as 25%. The goal of treatment is effective blood pressure control. This will reduce your risk for complications. Your caregiver will help you determine the best treatment for you according to your lifestyle. What is excellent treatment for one person, may not be for you. HOME CARE INSTRUCTIONS   Do not smoke.   Follow the lifestyle changes outlined in the "Prevention" section.   If you are on medications, follow the directions   carefully. Blood pressure medications must be taken as prescribed. Skipping doses reduces their benefit. It also puts you at risk for problems.   Follow up with your caregiver, as directed.   If you are asked to monitor your blood pressure at home, follow the guidelines in the "Diagnosis" section above.  SEEK MEDICAL CARE IF:   You think you are having medication side effects.   You have recurrent headaches or lightheadedness.     You have swelling in your ankles.   You have trouble with your vision.  SEEK IMMEDIATE MEDICAL CARE IF:   You have sudden onset of chest pain or pressure, difficulty breathing, or other symptoms of a heart attack.   You have a severe headache.   You have symptoms of a stroke (such as sudden weakness, difficulty speaking, difficulty walking).  MAKE SURE YOU:   Understand these instructions.   Will watch your condition.   Will get help right away if you are not doing well or get worse.  Document Released: 03/20/2005 Document Revised: 03/09/2011 Document Reviewed: 10/18/2006 [employer] Patient Information 2012 [employer]. 

## 2011-09-01 NOTE — ED Notes (Signed)
Pt states he is having PT at home after having a stroke. States he was told today by PT his bp was elevated

## 2011-09-01 NOTE — ED Provider Notes (Signed)
History   This chart was scribed for Dayton Bailiff, MD by Clarita Crane. The patient was seen in room APA18/[facility]18. Patient's care was started at 0943.    CSN: [id number]  Arrival date & time 09/01/11  4098   First MD Initiated Contact with Patient 09/01/11 [phone]      Chief complaint: Hypertension   (Consider location/radiation/quality/duration/timing/severity/associated sxs/prior treatment) HPI Jesus Lin is a 66 y.o. male who presents to the Emergency Department complaining of an episode of moderate to severe elevated blood pressure this morning which was measured as high as 210/140 at home with associated dizziness and SOB. Denies HA, visual changes, chest pain, nausea, vomiting. Patient with h/o COPD and is a former smoker.   PCP- None  Past Medical History  Diagnosis Date  . COPD (chronic obstructive pulmonary disease)   . Stroke     History reviewed. No pertinent past surgical history.  No family history on file.  History  Substance Use Topics  . Smoking status: Current Everyday Smoker  . Smokeless tobacco: Not on file  . Alcohol Use: Yes      Review of Systems  Constitutional: Negative for fever and chills.  HENT: Negative for rhinorrhea and neck pain.   Eyes: Negative for pain.  Respiratory: Positive for shortness of breath. Negative for cough.   Cardiovascular: Negative for chest pain.       Elevated blood pressure.   Gastrointestinal: Negative for nausea, vomiting, abdominal pain and diarrhea.  Genitourinary: Negative for dysuria.  Musculoskeletal: Negative for back pain.  Skin: Negative for rash.  Neurological: Positive for dizziness. Negative for weakness.    Allergies  Review of patient's allergies indicates no known allergies.  Home Medications   Current Outpatient Rx  Name Route Sig Dispense Refill  . ASPIRIN 325 MG PO TABS Oral Take 325 mg by mouth daily.    Marland Kitchen LISINOPRIL 10 MG PO TABS Oral Take 1 tablet (10 mg total) by mouth daily. 30  tablet 0    BP 165/94  Pulse 61  Temp(Src) 98 F (36.7 C) (Oral)  Resp 18  Ht 5\' 11"  (1.803 m)  Wt 132 lb (59.875 kg)  BMI 18.41 kg/m2  SpO2 100%  Physical Exam  Nursing note and vitals reviewed. Constitutional: He is oriented to person, place, and time. He appears well-developed and well-nourished. No distress.  HENT:  Head: Normocephalic and atraumatic.  Eyes: EOM are normal. Pupils are equal, round, and reactive to light.  Neck: Neck supple. No tracheal deviation present.  Cardiovascular: Normal rate and regular rhythm.  Exam reveals no gallop and no friction rub.   No murmur heard. Pulmonary/Chest: Effort normal. No respiratory distress. He has no wheezes. He has no rales.  Abdominal: Soft. He exhibits no distension.  Musculoskeletal: Normal range of motion. He exhibits no edema.  Neurological: He is alert and oriented to person, place, and time. No sensory deficit.       Strength 5/5 in bilateral upper and lower extremities.   Skin: Skin is warm and dry.  Psychiatric: He has a normal mood and affect. His behavior is normal.    ED Course  Procedures (including critical care time)   Date: 09/01/2011  Rate: 62  Rhythm: normal sinus rhythm  QRS Axis: normal  Intervals: normal  ST/T Wave abnormalities: normal  Conduction Disutrbances:none  Narrative Interpretation:   Old EKG Reviewed: unchanged  DIAGNOSTIC STUDIES: Oxygen Saturation is 97% on room air, normal by my interpretation.    COORDINATION OF CARE:  10:00AM-Patient informed of current plan for treatment and evaluation and agrees with plan at this time.       Labs Reviewed  BASIC METABOLIC PANEL - Abnormal; Notable for the following:    GFR calc non Af Amer 88 (*)    All other components within normal limits  URINALYSIS, ROUTINE W REFLEX MICROSCOPIC   Dg Chest 2 View  09/01/2011  *RADIOLOGY REPORT*  Clinical Data: COPD.  Stroke  CHEST - 2 VIEW  Comparison: 08/24/2011  Findings: COPD with pulmonary  hyperinflation.  Negative for pneumonia.  Negative for mass lesion.  Mild prominence of the right hilum due to prominent vessels is unchanged.  IMPRESSION: COPD.  No acute cardiopulmonary disease.  Original Report Authenticated By: Camelia Phenes, M.D.     1. Hypertension       MDM  lab studies and EKG unremarkable. Chest x-ray unremarkable. He will be discharged home. I discussed antihypertensive medications with the hospitalist on call. We decided to place the patient on lisinopril 10 mg and instructed to followup with his primary care physician      I personally performed the services described in this documentation, which was scribed in my presence. The recorded information has been reviewed and considered.    Dayton Bailiff, MD 09/01/11 [phone]

## 2011-11-02 DIAGNOSIS — Z8673 Personal history of transient ischemic attack (TIA), and cerebral infarction without residual deficits: Secondary | ICD-10-CM

## 2011-12-30 ENCOUNTER — Emergency Department (HOSPITAL_COMMUNITY)
Admission: EM | Admit: 2011-12-30 | Discharge: 2012-01-01 | Disposition: A | Discharge status: Other Acute Inpatient Hospital | Payer: Medicare PPO | Attending: Emergency Medicine | Admitting: Emergency Medicine

## 2011-12-30 ENCOUNTER — Encounter (HOSPITAL_COMMUNITY): Payer: Self-pay | Admitting: Emergency Medicine

## 2011-12-30 DIAGNOSIS — J4489 Other specified chronic obstructive pulmonary disease: Secondary | ICD-10-CM | POA: Insufficient documentation

## 2011-12-30 DIAGNOSIS — R197 Diarrhea, unspecified: Secondary | ICD-10-CM | POA: Insufficient documentation

## 2011-12-30 DIAGNOSIS — R000 Tachycardia, unspecified: Secondary | ICD-10-CM | POA: Insufficient documentation

## 2011-12-30 DIAGNOSIS — F17200 Nicotine dependence, unspecified, uncomplicated: Secondary | ICD-10-CM | POA: Insufficient documentation

## 2011-12-30 DIAGNOSIS — J449 Chronic obstructive pulmonary disease, unspecified: Secondary | ICD-10-CM | POA: Insufficient documentation

## 2011-12-30 DIAGNOSIS — F1020 Alcohol dependence, uncomplicated: Secondary | ICD-10-CM | POA: Insufficient documentation

## 2011-12-30 DIAGNOSIS — K029 Dental caries, unspecified: Secondary | ICD-10-CM | POA: Insufficient documentation

## 2011-12-30 DIAGNOSIS — R259 Unspecified abnormal involuntary movements: Secondary | ICD-10-CM | POA: Insufficient documentation

## 2011-12-30 DIAGNOSIS — Z8673 Personal history of transient ischemic attack (TIA), and cerebral infarction without residual deficits: Secondary | ICD-10-CM | POA: Insufficient documentation

## 2011-12-30 LAB — RAPID URINE DRUG SCREEN, HOSP PERFORMED
Amphetamines: NOT DETECTED
Barbiturates: NOT DETECTED
Cocaine: NOT DETECTED
Tetrahydrocannabinol: NOT DETECTED

## 2011-12-30 LAB — CBC WITH DIFFERENTIAL/PLATELET
Basophils Absolute: 0 10*3/uL (ref 0.0–0.1)
Basophils Relative: 1 % (ref 0–1)
Hemoglobin: 14.1 g/dL (ref 13.0–17.0)
Lymphocytes Relative: 37 % (ref 12–46)
MCHC: 32.3 g/dL (ref 30.0–36.0)
Neutro Abs: 3.4 10*3/uL (ref 1.7–7.7)
Neutrophils Relative %: 53 % (ref 43–77)
RDW: 17.5 % — ABNORMAL HIGH (ref 11.5–15.5)
WBC: 6.4 10*3/uL (ref 4.0–10.5)

## 2011-12-30 LAB — COMPREHENSIVE METABOLIC PANEL
ALT: 10 U/L (ref 0–53)
AST: 22 U/L (ref 0–37)
Albumin: 3.8 g/dL (ref 3.5–5.2)
Alkaline Phosphatase: 125 U/L — ABNORMAL HIGH (ref 39–117)
CO2: 15 mEq/L — ABNORMAL LOW (ref 19–32)
Chloride: 96 mEq/L (ref 96–112)
Potassium: 4.4 mEq/L (ref 3.5–5.1)
Total Bilirubin: 0.8 mg/dL (ref 0.3–1.2)

## 2011-12-30 LAB — URINE MICROSCOPIC-ADD ON

## 2011-12-30 LAB — URINALYSIS, ROUTINE W REFLEX MICROSCOPIC
Bilirubin Urine: NEGATIVE
Glucose, UA: NEGATIVE mg/dL
Protein, ur: 30 mg/dL — AB
Urobilinogen, UA: 0.2 mg/dL (ref 0.0–1.0)

## 2011-12-30 LAB — ETHANOL: Alcohol, Ethyl (B): 25 mg/dL — ABNORMAL HIGH (ref 0–11)

## 2011-12-30 MED ORDER — VITAMIN B-1 100 MG PO TABS
100.0000 mg | ORAL_TABLET | Freq: Every day | ORAL | Status: DC
  Administered 2011-12-31: 100 mg via ORAL
  Filled 2011-12-30 (×2): qty 1

## 2011-12-30 MED ORDER — LORAZEPAM 2 MG/ML IJ SOLN: 0.0000 mg | Freq: Two times a day (BID) | INTRAMUSCULAR | Status: DC

## 2011-12-30 MED ORDER — SODIUM CHLORIDE 0.9 % IV BOLUS (SEPSIS)
500.0000 mL | Freq: Once | INTRAVENOUS | Status: AC
  Administered 2011-12-31: 500 mL via INTRAVENOUS

## 2011-12-30 MED ORDER — VITAMIN B-1 100 MG PO TABS
100.0000 mg | ORAL_TABLET | Freq: Once | ORAL | Status: AC
  Administered 2011-12-30: 100 mg via ORAL
  Filled 2011-12-30: qty 1

## 2011-12-30 MED ORDER — FOLIC ACID 1 MG PO TABS
1.0000 mg | ORAL_TABLET | Freq: Once | ORAL | Status: AC
  Administered 2011-12-30: 1 mg via ORAL
  Filled 2011-12-30: qty 1

## 2011-12-30 MED ORDER — LORAZEPAM 2 MG/ML IJ SOLN
1.0000 mg | Freq: Four times a day (QID) | INTRAMUSCULAR | Status: DC | PRN
  Administered 2011-12-30: 2 mg via INTRAVENOUS
  Filled 2011-12-30 (×2): qty 1

## 2011-12-30 MED ORDER — LORAZEPAM 1 MG PO TABS
1.0000 mg | ORAL_TABLET | Freq: Four times a day (QID) | ORAL | Status: DC | PRN
  Administered 2011-12-31: 1 mg via ORAL
  Filled 2011-12-30: qty 1

## 2011-12-30 MED ORDER — ADULT MULTIVITAMIN W/MINERALS CH
1.0000 | ORAL_TABLET | Freq: Every day | ORAL | Status: DC
  Administered 2011-12-31: 1 via ORAL
  Filled 2011-12-30 (×2): qty 1

## 2011-12-30 MED ORDER — LORAZEPAM 2 MG/ML IJ SOLN
0.0000 mg | Freq: Four times a day (QID) | INTRAMUSCULAR | Status: DC
  Administered 2011-12-30 – 2011-12-31 (×3): 1 mg via INTRAVENOUS
  Filled 2011-12-30 (×2): qty 1

## 2011-12-30 MED ORDER — FOLIC ACID 1 MG PO TABS
1.0000 mg | ORAL_TABLET | Freq: Every day | ORAL | Status: DC
  Administered 2011-12-31: 1 mg via ORAL
  Filled 2011-12-30 (×2): qty 1

## 2011-12-30 MED ORDER — ADULT MULTIVITAMIN W/MINERALS CH
1.0000 | ORAL_TABLET | Freq: Once | ORAL | Status: AC
  Administered 2011-12-30: 1 via ORAL
  Filled 2011-12-30: qty 1

## 2011-12-30 MED ORDER — LORAZEPAM 1 MG PO TABS: 1.0000 mg | ORAL_TABLET | Freq: Three times a day (TID) | ORAL | Status: DC | PRN

## 2011-12-30 MED ORDER — THIAMINE HCL 100 MG/ML IJ SOLN: 100.0000 mg | Freq: Every day | INTRAMUSCULAR | Status: DC

## 2011-12-30 MED ORDER — LORAZEPAM 2 MG/ML IJ SOLN
INTRAMUSCULAR | Status: AC
  Administered 2011-12-30: 2 mg via INTRAVENOUS
  Filled 2011-12-30: qty 1

## 2011-12-30 NOTE — ED Notes (Signed)
Jerking movement has stopped except for let eye. Pt alert and oriented. States he was on ativan and ran out about three weeks ago

## 2011-12-30 NOTE — ED Provider Notes (Signed)
History   This chart was scribed for Tobin Chad, MD, by Frederik Pear. The patient was seen in room APA04/APA04 and the patient's care was started at 1140.    CSN: [id number]  Arrival date & time 12/30/11  1050   First MD Initiated Contact with Patient 12/30/11 1140      Chief Complaint  Patient presents with  . Shaking    (Consider location/radiation/quality/duration/timing/severity/associated sxs/prior treatment) HPI Comments: Jesus Lin is a 66 y.o. male who presents to the Emergency Department complaining of moderate, consistent shaking to his left arm and hand that began PTA and persisted until the pt was in the ED. Pt reports no h/o of previous episodes or seizures. Pt reports associated diarrhea. Pt denies any fever, nausea, vomiting or rashes. Pt states that he is a regular alcohol user and last used this morning. Pt states that he used yesterday at 9:30 am, but was dry for the rest of the day. Pt reports that he lives with his sister and that his last hospitalization was one month ago for a CVA.    No PCP.   Past Medical History  Diagnosis Date  . COPD (chronic obstructive pulmonary disease)   . Stroke     History reviewed. No pertinent past surgical history.  History reviewed. No pertinent family history.  History  Substance Use Topics  . Smoking status: Current Every Day Smoker  . Smokeless tobacco: Not on file  . Alcohol Use: Yes     liquor daily      Review of Systems  Constitutional: Negative for fever.  Gastrointestinal: Positive for diarrhea. Negative for nausea and vomiting.  Skin: Negative for rash.  Neurological:       Pt reports shaking in his left arm and hand.  All other systems reviewed and are negative.    Allergies  Review of patient's allergies indicates no known allergies.  Home Medications  No current outpatient prescriptions on file.  BP 200/132  Pulse 129  Temp 98.9 F (37.2 C) (Oral)  Resp 20  Ht 5\' 5"   (1.651 m)  Wt 130 lb (58.968 kg)  BMI 21.63 kg/m2  SpO2 96%  Physical Exam  Nursing note and vitals reviewed. Constitutional: He is oriented to person, place, and time. He appears well-developed. He is active.  Non-toxic appearance. He does not have a sickly appearance. He does not appear ill. No distress. He is not intubated.       Pt is alert and pleasant.  His clothing appears well worn and is soiled.  He is not well groomed and smells of alcohol and dirt.  Pt appears thin but not cachectic.  He appears chronically malnourished.  HENT:  Head: Normocephalic and atraumatic. Not macrocephalic and not microcephalic. Head is without Battle's sign, without abrasion, without contusion, without right periorbital erythema and without left periorbital erythema. No trismus in the jaw.  Right Ear: Hearing, tympanic membrane, external ear and ear canal normal. No mastoid tenderness.  Left Ear: Tympanic membrane, external ear and ear canal normal. No mastoid tenderness.  Nose: Nose normal. Right sinus exhibits no maxillary sinus tenderness and no frontal sinus tenderness. Left sinus exhibits no maxillary sinus tenderness and no frontal sinus tenderness.  Mouth/Throat: Uvula is midline and oropharynx is clear and moist. Mucous membranes are not pale, not dry and not cyanotic. Dental caries present. No tonsillar abscesses.  Eyes: Conjunctivae normal, EOM and lids are normal. Pupils are equal, round, and reactive to light. Right conjunctiva is  not injected. Right conjunctiva has no hemorrhage. Left conjunctiva is not injected. Left conjunctiva has no hemorrhage. No scleral icterus. Right eye exhibits normal extraocular motion and no nystagmus. Left eye exhibits normal extraocular motion and no nystagmus. Right pupil is round and reactive. Left pupil is round and reactive. Pupils are equal.  Neck: Trachea normal, normal range of motion, full passive range of motion without pain and phonation normal. Neck supple.  Normal carotid pulses and no JVD present. No tracheal tenderness, no spinous process tenderness and no muscular tenderness present. Carotid bruit is not present. No rigidity. No tracheal deviation, no edema, no erythema and normal range of motion present. No thyromegaly present.  Cardiovascular: Regular rhythm, intact distal pulses and normal pulses.  Tachycardia present.  PMI is not displaced.  Exam reveals no decreased pulses.   No murmur heard. Pulmonary/Chest: Effort normal and breath sounds normal. No accessory muscle usage or stridor. No apnea, not tachypneic and not bradypneic. He is not intubated. No respiratory distress. He has no decreased breath sounds. He has no wheezes. He has no rhonchi. He has no rales.  Abdominal: Soft. Normal appearance. He exhibits no distension, no pulsatile midline mass and no mass. There is no hepatosplenomegaly. There is no tenderness. There is no rigidity, no rebound, no guarding, no CVA tenderness, no tenderness at McBurney's point and negative Murphy's sign.  Musculoskeletal: Normal range of motion. He exhibits no edema.  Neurological: He is alert and oriented to person, place, and time. He has normal strength. He displays tremor. He displays no atrophy. No cranial nerve deficit or sensory deficit. He exhibits normal muscle tone. He displays no seizure activity. GCS eye subscore is 4. GCS verbal subscore is 5. GCS motor subscore is 6.  Reflex Scores:      Patellar reflexes are 2+ on the right side and 2+ on the left side.      Fine tremor observed.  No asterixis.   Skin: Skin is warm and dry. No rash noted. He is not diaphoretic. No cyanosis or erythema. No pallor. Nails show no clubbing.  Psychiatric: He has a normal mood and affect. His speech is normal and behavior is normal. Thought content normal.    ED Course  Procedures (including critical care time)  DIAGNOSTIC STUDIES: Oxygen Saturation is 96% on room air, normal by my interpretation.     COORDINATION OF CARE:  12:30- Discussed planned course of treatment with the patient, who is agreeable at this time.     Labs Reviewed  CBC WITH DIFFERENTIAL - Abnormal; Notable for the following:    RDW 17.5 (*)     All other components within normal limits  COMPREHENSIVE METABOLIC PANEL - Abnormal; Notable for the following:    CO2 15 (*)     Glucose, Bld 132 (*)     Alkaline Phosphatase 125 (*)     GFR calc non Af Amer 84 (*)     All other components within normal limits  ETHANOL - Abnormal; Notable for the following:    Alcohol, Ethyl (B) 25 (*)     All other components within normal limits  GLUCOSE, CAPILLARY  URINALYSIS, ROUTINE W REFLEX MICROSCOPIC  URINE RAPID DRUG SCREEN (HOSP PERFORMED)   No results found.   No diagnosis found.   Date: 12/30/2011  Rate: 99 bpm  Rhythm: sinus  QRS Axis: left  Intervals: normal  ST/T Wave abnormalities: nonspecific ST changes  Conduction Disutrbances:none  Narrative Interpretation: anteroseptal q waves  Old EKG Reviewed:  unchanged      MDM  Pt presents for evaluation after an episode of shaking this morning.  He states it did not stop until after he was administered medicine in his IV here.  The shaking was limited to his left hand and arm.  He denies any unilateral weakness.  Note elevated HR and BP currently.  Plan basic labs, EKG, seizure precautions, serial reassessments.  Pt has a hx of alcohol abuse and states he drank yesterday morning, had no alcohol in the afternoon and overnight, and only had a "sip" this morning.  He received ativan prior to my evaluation.  He  May have been experiencing early alcohol withdrawal.  Will continue to watch closely as results become available.    Pt stable, A&O. Act is attempting to find a pysc bed for etoh problems  I personally performed the services described in this documentation, which was scribed in my presence. The recorded information has been reviewed and  considered.      Tobin Chad, MD 12/31/11 [phone]

## 2011-12-30 NOTE — BH Assessment (Addendum)
Assessment Note   Jesus Lin is an 66 y.o. male. PT PRESENTED WITH FEELINGS OF WITHDRAWALS. HE REPORTS HE DRINKS A PINT OF LIQUOR DAILY BUT THE PAST 2 DAYS HE'S ONLY HAD A SHOT OR TWO AND BELIEVES HE STARTED TO HAVE WITHDRAWALS.  HE WANTS TO QUIT.  AFTER RECEIVING ATIVAN IN THE ED PT FELT BETTER BUT DECIDED HE WANTED TO GET DETOX. PT REPORTS HAVING A MINI STROKE IN MAY BUT HE IS AMBULATORY AND SHOWS NO PHYSICAL SIGNS OF A STROKE.  HE DENIES A SEIZURE HISTORY.  HE DENIES S/I, H/I AND IS NOT PSYCHOTIC.       Axis I: ALCOHOL DEPENDENCY Axis II: Deferred Axis III:  Past Medical History  Diagnosis Date  . COPD (chronic obstructive pulmonary disease)   . Stroke    Axis IV: problems related to social environment Axis V: 41-50 serious symptoms  Past Medical History:  Past Medical History  Diagnosis Date  . COPD (chronic obstructive pulmonary disease)   . Stroke     History reviewed. No pertinent past surgical history.  Family History: History reviewed. No pertinent family history.  Social History:  reports that he has been smoking.  He does not have any smokeless tobacco history on file. He reports that he drinks alcohol. He reports that he does not use illicit drugs.  Additional Social History:  Alcohol / Drug Use Pain Medications: NA Prescriptions: NA Over the Counter: NA History of alcohol / drug use?: Yes Substance #1 Name of Substance 1: ALCOHOL 1 - Age of First Use: 17 1 - Amount (size/oz): 1 PINT LIQUOR  1 - Frequency: DAILY 1 - Duration: 15 YEARS 1 - Last Use / Amount: 12/30/11  1 SHOT OF VODKA  CIWA: CIWA-Ar BP: 163/101 mmHg Pulse Rate: 90  Nausea and Vomiting: mild nausea with no vomiting Tactile Disturbances: none Tremor: not visible, but can be felt fingertip to fingertip Auditory Disturbances: very mild harshness or ability to frighten Paroxysmal Sweats: barely perceptible sweating, palms moist Visual Disturbances: very mild sensitivity Anxiety:  mildly anxious Headache, Fullness in Head: very mild Agitation: somewhat more than normal activity Orientation and Clouding of Sensorium: oriented and can do serial additions CIWA-Ar Total: 8  COWS:    Allergies: No Known Allergies  Home Medications:  (Not in a hospital admission)  OB/GYN Status:  No LMP for male patient.  General Assessment Data Location of Assessment: [facility] ACT Assessment: Yes Living Arrangements: Other relatives Can pt return to current living arrangement?: Yes Admission Status: Voluntary Is patient capable of signing voluntary admission?: Yes [facility] Referral Source: MD (DR Lowell Guitar)  Education Status Contact person: Jesus SIDDLE-[phone] (BROTHER)  Risk to self Suicidal Ideation: No Suicidal Intent: No Is patient at risk for suicide?: No Suicidal Plan?: No Access to Means: No What has been your use of drugs/alcohol within the last 12 months?: ALCOHOL Previous Attempts/Gestures: No How many times?: 0  Other Self Harm Risks: NA Triggers for Past Attempts: None known Intentional Self Injurious Behavior: None Family Suicide History: No Recent stressful life event(s): Recent negative physical changes (STROKE IN MAY 2013) Persecutory voices/beliefs?: No Depression: Yes Depression Symptoms: Fatigue;Loss of interest in usual pleasures;Despondent Substance abuse history and/or treatment for substance abuse?: Yes Suicide prevention information given to non-admitted patients: Not applicable  Risk to Others Homicidal Ideation: No Thoughts of Harm to Others: No Current Homicidal Intent: No Current Homicidal Plan: No Access to Homicidal Means: No History of harm to others?:  No Assessment of Violence: None Noted Violent Behavior Description: NA Does patient have access to weapons?: No Criminal Charges Pending?: No Does patient have a court date: No  Psychosis Hallucinations: None noted Delusions: None  noted  Mental Status Report Appear/Hygiene: Improved Eye Contact: Good Motor Activity: Freedom of movement Speech: Logical/coherent Level of Consciousness: Alert Mood: Depressed Affect: Appropriate to circumstance;Depressed Anxiety Level: Minimal Thought Processes: Coherent;Relevant Judgement: Unimpaired Orientation: Person;Place;Time;Situation Obsessive Compulsive Thoughts/Behaviors: None  Cognitive Functioning Concentration: Normal Memory: Recent Intact;Remote Intact IQ: Average Insight: Fair Impulse Control: Fair Appetite: Fair Sleep: No Change Total Hours of Sleep: 9  Vegetative Symptoms: None  ADLScreening Jesus Lin?: Yes Patient able to express need for assistance with ADLs?: Yes Independently performs ADLs?: Yes (appropriate for developmental age)  Abuse/Neglect [facility]) Physical Abuse: Denies Verbal Abuse: Denies Sexual Abuse: Denies  Prior Inpatient Therapy Prior Inpatient Therapy: Yes Prior Therapy Dates: AGE 24-[facility] Prior Therapy Facilty/Provider(s): [facility] Reason for Treatment: DETOX REHAB  Prior Outpatient Therapy Prior Outpatient Therapy: No Prior Therapy Dates: NA Prior Therapy Facilty/Provider(s): NA Reason for Treatment: NA  ADL Screening (condition at time of admission) Patient's cognitive ability adequate to safely complete daily Lin?: Yes Patient able to express need for assistance with ADLs?: Yes Independently performs ADLs?: Yes (appropriate for developmental age) Weakness of Legs: None Weakness of Arms/Hands: None  Home Assistive Devices/Equipment Home Assistive Devices/Equipment: None  Therapy Consults (therapy consults require a physician order) PT Evaluation Needed: No OT Evalulation Needed: No SLP Evaluation Needed: No Abuse/Neglect Assessment (Assessment to be complete while patient is alone) Physical Abuse: Denies Verbal  Abuse: Denies Sexual Abuse: Denies Exploitation of patient/patient's resources: Denies Self-Neglect: Denies Values / Beliefs Cultural Requests During Hospitalization: None Spiritual Requests During Hospitalization: None Consults Spiritual Care Consult Needed: No Social Work Consult Needed: No Merchant navy officer (For Healthcare) Advance Directive: Patient does not have advance directive;Patient would not like information Pre-existing out of facility DNR order (yellow form or pink MOST form): No    Additional Information 1:1 In Past 12 Months?: No CIRT Risk: No Elopement Risk: No Does patient have medical clearance?: Yes     Disposition: REFERRED TO [facility]. PATIENT HAS BEEN ACCEPTED TO [facility] BY DR Les Pou. HE IS A VOLUNTARY ADMISSION AND WILL BE TRANSPORTED BY [facility] OR EMS. DR Preston Fleeting IS IN AGREEMENT WITH THE DISPOSITION.Disposition Disposition of Patient: Inpatient treatment program Type of inpatient treatment program: Adult  On Site Evaluation by:  DR Lowell Guitar Reviewed with Physician:  DR ZAMMIT   Hattie Perch Winford 12/30/2011 7:39 PM

## 2011-12-30 NOTE — ED Notes (Signed)
Pt states just started shaking on L side "just a few minutes ago". Pt is alert/oriented. Pt having jerking movements to L side of body, mostly L arm. L arm is tense and appears slightly contracted. Twitching noted to L side of face as well. Pt admits to ETOH daily and already had some today. Pt denies pain.

## 2011-12-31 LAB — BASIC METABOLIC PANEL
BUN: 9 mg/dL (ref 6–23)
Chloride: 98 mEq/L (ref 96–112)
Creatinine, Ser: 0.89 mg/dL (ref 0.50–1.35)
GFR calc Af Amer: >90 mL/min (ref >90)
Glucose, Bld: 83 mg/dL (ref 70–99)
Potassium: 3.8 mEq/L (ref 3.5–5.1)

## 2011-12-31 MED ORDER — VITAMIN B-1 100 MG PO TABS: 100.0000 mg | ORAL_TABLET | Freq: Every day | ORAL | Status: DC

## 2011-12-31 MED ORDER — THIAMINE HCL 100 MG/ML IJ SOLN
100.0000 mg | Freq: Once | INTRAMUSCULAR | Status: AC
  Administered 2011-12-31: 100 mg via INTRAMUSCULAR
  Filled 2011-12-31: qty 2

## 2011-12-31 MED ORDER — CHLORDIAZEPOXIDE HCL 25 MG PO CAPS
25.0000 mg | ORAL_CAPSULE | Freq: Four times a day (QID) | ORAL | Status: DC | PRN
  Filled 2011-12-31: qty 1

## 2011-12-31 MED ORDER — CHLORDIAZEPOXIDE HCL 25 MG PO CAPS
25.0000 mg | ORAL_CAPSULE | Freq: Four times a day (QID) | ORAL | Status: DC
  Administered 2011-12-31 (×2): 25 mg via ORAL
  Filled 2011-12-31: qty 1

## 2011-12-31 MED ORDER — NICOTINE 21 MG/24HR TD PT24
21.0000 mg | MEDICATED_PATCH | Freq: Once | TRANSDERMAL | Status: DC
  Administered 2011-12-31: 21 mg via TRANSDERMAL
  Filled 2011-12-31: qty 1

## 2011-12-31 MED ORDER — CHLORDIAZEPOXIDE HCL 25 MG PO CAPS: 25.0000 mg | ORAL_CAPSULE | Freq: Three times a day (TID) | ORAL | Status: DC

## 2011-12-31 MED ORDER — CHLORDIAZEPOXIDE HCL 25 MG PO CAPS: 25.0000 mg | ORAL_CAPSULE | Freq: Every day | ORAL | Status: DC

## 2011-12-31 MED ORDER — LISINOPRIL 5 MG PO TABS
5.0000 mg | ORAL_TABLET | Freq: Every day | ORAL | Status: DC
  Administered 2011-12-31: 5 mg via ORAL
  Filled 2011-12-31 (×3): qty 1

## 2011-12-31 MED ORDER — CHLORDIAZEPOXIDE HCL 25 MG PO CAPS: 25.0000 mg | ORAL_CAPSULE | ORAL | Status: DC

## 2011-12-31 NOTE — ED Notes (Addendum)
Pt's sister called requesting an update on pt's status. Pt advised of plan of care and gave permission to call sister and update her as well. Pt's sister, Debby Freiberg, contact number is [phone]. Pt sister was updated and verbalized understanding of care plan.

## 2011-12-31 NOTE — ED Notes (Signed)
Pt lying in bed crying, stating that his back is killing him, MD made aware.

## 2011-12-31 NOTE — Progress Notes (Signed)
Spoke with Beth at [facility] who stated there are still no beds and pt will be reviewed for their wait list tomorrow.

## 2011-12-31 NOTE — ED Notes (Addendum)
Intake from [facility] called regarding pt. Pt has been accepted by Dr.Carlton but pt can not be transported there until after 7 am Monday morning. Intake stated that [facility] would call around 5:30 am to give pt bed assignment. Neysa Bonito, RN and [place] from Act team made aware.

## 2011-12-31 NOTE — BH Assessment (Signed)
Assessment Note   Jesus Lin is an 66 y.o. male. PT CONTINUES TO WANT DETOX.  SPOKE Jesus Lin , AC AT [facility] AND STILL NO D/C NOR BEDS.  FAXED REFERRAL TO [facility].      Axis I: ALCOHOL DEPENDENCY Axis II: Deferred Axis III:  Past Medical History  Diagnosis Date  . COPD (chronic obstructive pulmonary disease)   . Stroke    Axis IV: other psychosocial or environmental problems, problems related to social environment, problems with access to health care services and problems with primary support group Axis V: 21-30 behavior considerably influenced by delusions or hallucinations OR serious impairment in judgment, communication OR inability to function in almost all areas     Past Medical History:  Past Medical History  Diagnosis Date  . COPD (chronic obstructive pulmonary disease)   . Stroke     History reviewed. No pertinent past surgical history.  Family History: History reviewed. No pertinent family history.  Social History:  reports that he has been smoking.  He does not have any smokeless tobacco history on file. He reports that he drinks alcohol. He reports that he does not use illicit drugs.  Additional Social History:  Alcohol / Drug Use Pain Medications: NA Prescriptions: NA Over the Counter: NA History of alcohol / drug use?: Yes Substance #1 Name of Substance 1: ALCOHOL 1 - Age of First Use: 17 1 - Amount (size/oz): 1 PINT LIQUOR  1 - Frequency: DAILY 1 - Duration: 15 YEARS 1 - Last Use / Amount: 12/30/11  1 SHOT OF VODKA  CIWA: CIWA-Ar BP: 138/92 mmHg Pulse Rate: 83  Nausea and Vomiting: no nausea and no vomiting Tactile Disturbances: none Tremor: no tremor Auditory Disturbances: not present Paroxysmal Sweats: no sweat visible Visual Disturbances: not present Anxiety: no anxiety, at ease Headache, Fullness in Head: none present Agitation: normal activity Orientation and Clouding of Sensorium: oriented and can do serial  additions CIWA-Ar Total: 0  COWS:    Allergies: No Known Allergies  Home Medications:  (Not in a hospital admission)  OB/GYN Status:  No LMP for male patient.  General Assessment Data Location of Assessment: [facility] ACT Assessment: Yes Living Arrangements: Other relatives Can pt return to current living arrangement?: Yes Admission Status: Voluntary Is patient capable of signing voluntary admission?: Yes [facility] Referral Source: MD (DR Lowell Guitar)  Education Status Contact person: Jesus Lin (BROTHER)  Risk to self Suicidal Ideation: No Suicidal Intent: No Is patient at risk for suicide?: No Suicidal Plan?: No Access to Means: No What has been your use of drugs/alcohol within the last 12 months?: ALCOHOL Previous Attempts/Gestures: No How many times?: 0  Other Self Harm Risks: NA Triggers for Past Attempts: None known Intentional Self Injurious Behavior: None Family Suicide History: No Recent stressful life event(s): Recent negative physical changes (STROKE IN MAY 2013) Persecutory voices/beliefs?: No Depression: Yes Depression Symptoms: Fatigue;Loss of interest in usual pleasures;Despondent Substance abuse history and/or treatment for substance abuse?: Yes Suicide prevention information given to non-admitted patients: Not applicable  Risk to Others Homicidal Ideation: No Thoughts of Harm to Others: No Current Homicidal Intent: No Current Homicidal Plan: No Access to Homicidal Means: No History of harm to others?: No Assessment of Violence: None Noted Violent Behavior Description: NA Does patient have access to weapons?: No Criminal Charges Pending?: No Does patient have a court date: No  Psychosis Hallucinations: None noted Delusions: None noted  Mental Status Report Appear/Hygiene: Improved Eye  Contact: Good Motor Activity: Freedom of movement Speech: Logical/coherent Level of Consciousness: Alert Mood:  Depressed Affect: Appropriate to circumstance;Depressed Anxiety Level: Minimal Thought Processes: Coherent;Relevant Judgement: Unimpaired Orientation: Person;Place;Time;Situation Obsessive Compulsive Thoughts/Behaviors: None  Cognitive Functioning Concentration: Normal Memory: Recent Intact;Remote Intact IQ: Average Insight: Fair Impulse Control: Fair Appetite: Fair Sleep: No Change Total Hours of Sleep: 9  Vegetative Symptoms: None  ADLScreening [facility]) Patient's cognitive ability adequate to safely complete daily activities?: Yes Patient able to express need for assistance with ADLs?: Yes Independently performs ADLs?: Yes (appropriate for developmental age)  Abuse/Neglect [facility]) Physical Abuse: Denies Verbal Abuse: Denies Sexual Abuse: Denies  Prior Inpatient Therapy Prior Inpatient Therapy: Yes Prior Therapy Dates: AGE 88-[facility] Prior Therapy Facilty/Provider(s): [facility] Reason for Treatment: DETOX REHAB  Prior Outpatient Therapy Prior Outpatient Therapy: No Prior Therapy Dates: NA Prior Therapy Facilty/Provider(s): NA Reason for Treatment: NA  ADL Screening (condition at time of admission) Patient's cognitive ability adequate to safely complete daily activities?: Yes Patient able to express need for assistance with ADLs?: Yes Independently performs ADLs?: Yes (appropriate for developmental age) Weakness of Legs: None Weakness of Arms/Hands: None  Home Assistive Devices/Equipment Home Assistive Devices/Equipment: None  Therapy Consults (therapy consults require a physician order) PT Evaluation Needed: No OT Evalulation Needed: No SLP Evaluation Needed: No Abuse/Neglect Assessment (Assessment to be complete while patient is alone) Physical Abuse: Denies Verbal Abuse: Denies Sexual Abuse: Denies Exploitation of patient/patient's resources: Denies Self-Neglect: Denies Values / Beliefs Cultural Requests During Hospitalization:  None Spiritual Requests During Hospitalization: None Consults Spiritual Care Consult Needed: No Social Work Consult Needed: No Merchant navy officer (For Healthcare) Advance Directive: Patient does not have advance directive;Patient would not like information Pre-existing out of facility DNR order (yellow form or pink MOST form): No Nutrition Screen- [facility] Adult/[facility] Patient's home diet: Regular (pt given breakfast and lunch tray.)  Additional Information 1:1 In Past 12 Months?: No CIRT Risk: No Elopement Risk: No Does patient have medical clearance?: Yes     [facility] Disposition Disposition of Patient: Inpatient treatment program Type of inpatient treatment program: Adult ([facility])  On Site Evaluation by:   Reviewed with Physician:  DR Tobey Bride, Jesus Lin 12/31/2011 2:59 PM

## 2012-01-01 MED ORDER — IBUPROFEN 400 MG PO TABS
ORAL_TABLET | ORAL | Status: AC
  Filled 2012-01-01: qty 2

## 2012-01-01 MED ORDER — IBUPROFEN 400 MG PO TABS
600.0000 mg | ORAL_TABLET | Freq: Three times a day (TID) | ORAL | Status: DC | PRN
  Administered 2012-01-01: 600 mg via ORAL

## 2012-01-01 NOTE — ED Notes (Signed)
Complains of knee pain, MD notified.

## 2012-01-01 NOTE — B[facility] Counselor (Addendum)
The patient has been accepted [facility] by Dr. Les Pou. This was verified by a call and speaking with Jaxon in intake. He is assigned to [facility]. Support paperwork completed  and distributed.  Transportation will be by [facility] or local EMS. Dr Preston Fleeting notified of acceptance and is in agreement with the planned discharge. Speaking with patient to advise of transfer, he is still wanting detox and is agreeable to going to [facility].

## 2012-01-01 NOTE — ED Notes (Signed)
Continues resting quietly, no complaints offered.

## 2012-01-01 NOTE — ED Notes (Signed)
Has rested quietly all shift, no complaints other than knee pain, medication was effective for the knee pain.

## 2012-01-01 NOTE — ED Provider Notes (Signed)
He has been accepted at [facility] by Dr. Les Pou.  Dione Booze, MD 01/01/12 [phone]

## 2012-09-22 ENCOUNTER — Emergency Department (HOSPITAL_COMMUNITY)
Admission: EM | Admit: 2012-09-22 | Discharge: 2012-09-22 | Disposition: A | Discharge status: Home / Self Care | Payer: Medicare PPO | Attending: Emergency Medicine | Admitting: Emergency Medicine

## 2012-09-22 ENCOUNTER — Encounter (HOSPITAL_COMMUNITY): Payer: Self-pay | Admitting: Emergency Medicine

## 2012-09-22 ENCOUNTER — Emergency Department (HOSPITAL_COMMUNITY): Payer: Medicare PPO

## 2012-09-22 DIAGNOSIS — Z79899 Other long term (current) drug therapy: Secondary | ICD-10-CM | POA: Insufficient documentation

## 2012-09-22 DIAGNOSIS — Z8673 Personal history of transient ischemic attack (TIA), and cerebral infarction without residual deficits: Secondary | ICD-10-CM | POA: Insufficient documentation

## 2012-09-22 DIAGNOSIS — J4489 Other specified chronic obstructive pulmonary disease: Secondary | ICD-10-CM | POA: Insufficient documentation

## 2012-09-22 DIAGNOSIS — R569 Unspecified convulsions: Secondary | ICD-10-CM | POA: Insufficient documentation

## 2012-09-22 DIAGNOSIS — F1410 Cocaine abuse, uncomplicated: Secondary | ICD-10-CM | POA: Insufficient documentation

## 2012-09-22 DIAGNOSIS — F17200 Nicotine dependence, unspecified, uncomplicated: Secondary | ICD-10-CM | POA: Insufficient documentation

## 2012-09-22 DIAGNOSIS — J449 Chronic obstructive pulmonary disease, unspecified: Secondary | ICD-10-CM | POA: Insufficient documentation

## 2012-09-22 DIAGNOSIS — I10 Essential (primary) hypertension: Secondary | ICD-10-CM | POA: Insufficient documentation

## 2012-09-22 DIAGNOSIS — G40109 Localization-related (focal) (partial) symptomatic epilepsy and epileptic syndromes with simple partial seizures, not intractable, without status epilepticus: Secondary | ICD-10-CM

## 2012-09-22 DIAGNOSIS — F1010 Alcohol abuse, uncomplicated: Secondary | ICD-10-CM | POA: Insufficient documentation

## 2012-09-22 HISTORY — DX: Essential (primary) hypertension: I10

## 2012-09-22 LAB — COMPREHENSIVE METABOLIC PANEL
Alkaline Phosphatase: 136 U/L — ABNORMAL HIGH (ref 39–117)
BUN: 12 mg/dL (ref 6–23)
CO2: 18 mEq/L — ABNORMAL LOW (ref 19–32)
Chloride: 95 mEq/L — ABNORMAL LOW (ref 96–112)
GFR calc Af Amer: 69 mL/min — ABNORMAL LOW (ref >90)
GFR calc non Af Amer: 60 mL/min — ABNORMAL LOW (ref >90)
Glucose, Bld: 50 mg/dL — ABNORMAL LOW (ref 70–99)
Potassium: 4.5 mEq/L (ref 3.5–5.1)
Total Bilirubin: 0.7 mg/dL (ref 0.3–1.2)
Total Protein: 7.7 g/dL (ref 6.0–8.3)

## 2012-09-22 LAB — CBC WITH DIFFERENTIAL/PLATELET
Eosinophils Absolute: 0 10*3/uL (ref 0.0–0.7)
Hemoglobin: 14.5 g/dL (ref 13.0–17.0)
Lymphs Abs: 0.6 10*3/uL — ABNORMAL LOW (ref 0.7–4.0)
MCH: 32.7 pg (ref 26.0–34.0)
Monocytes Relative: 4 % (ref 3–12)
Neutrophils Relative %: 79 % — ABNORMAL HIGH (ref 43–77)
RBC: 4.44 MIL/uL (ref 4.22–5.81)

## 2012-09-22 LAB — POCT I-STAT, CHEM 8
Calcium, Ion: 1.02 mmol/L — ABNORMAL LOW (ref 1.13–1.30)
Glucose, Bld: 94 mg/dL (ref 70–99)
HCT: 47 % (ref 39.0–52.0)
Hemoglobin: 16 g/dL (ref 13.0–17.0)
TCO2: 17 mmol/L (ref 0–100)

## 2012-09-22 LAB — PROTIME-INR
INR: 1.09 (ref 0.00–1.49)
Prothrombin Time: 14 seconds (ref 11.6–15.2)

## 2012-09-22 LAB — ETHANOL: Alcohol, Ethyl (B): 22 mg/dL — ABNORMAL HIGH (ref 0–11)

## 2012-09-22 LAB — TROPONIN I: Troponin I: <0.3 ng/mL (ref <0.30)

## 2012-09-22 MED ORDER — LEVETIRACETAM 500 MG PO TABS: 500.0000 mg | ORAL_TABLET | Freq: Two times a day (BID) | ORAL | Status: DC

## 2012-09-22 MED ORDER — SODIUM CHLORIDE 0.9 % IV SOLN
1000.0000 mL | Freq: Once | INTRAVENOUS | Status: AC
  Administered 2012-09-22: 1000 mL via INTRAVENOUS

## 2012-09-22 MED ORDER — SODIUM CHLORIDE 0.9 % IV SOLN
1000.0000 mg | Freq: Once | INTRAVENOUS | Status: AC
  Administered 2012-09-22: 1000 mg via INTRAVENOUS
  Filled 2012-09-22: qty 10

## 2012-09-22 MED ORDER — LORAZEPAM 2 MG/ML IJ SOLN
2.0000 mg | Freq: Once | INTRAMUSCULAR | Status: AC
  Administered 2012-09-22: 2 mg via INTRAVENOUS

## 2012-09-22 MED ORDER — SODIUM CHLORIDE 0.9 % IV SOLN
1.0000 g | Freq: Once | INTRAVENOUS | Status: DC
  Filled 2012-09-22: qty 10

## 2012-09-22 MED ORDER — SODIUM CHLORIDE 0.9 % IV SOLN: 1000.0000 mL | INTRAVENOUS | Status: DC

## 2012-09-22 NOTE — ED Provider Notes (Signed)
History    This chart was scribed for Lyanne Co, MD by Leone Payor, ED Scribe. This patient was seen in room APA02/APA02 and the patient's care was started 12:01 PM.   CSN: [id number]  Arrival date & time 09/22/12  1156   None     Chief Complaint  Patient presents with  . Tremors     The history is provided by the patient and the EMS personnel. No language interpreter was used.    HPI Comments: Jesus Lin is a 67 y.o. male brought in by ambulance, who presents to the Emergency Department complaining of constant, unchanged tremors that started after waking this morning. Per EMS, pt was at home when they were called for a possible stroke. Pt is a heavy drinker but he denies decreasing or increasing his normal intake recently. Last drink was this morning. He denies any pain currently. He denies h/o seizures, strokes, or brain tumor. Pt denies having a fall or hitting his head.   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 No prior hx of seizure.  Patient reports mild headache at this time.  He denies recent fall or head trauma.  No chest pain or shortness of breath.  No abdominal pain.  No nausea or vomiting.  No hematemesis.  No melena or hematochezia.  He continues to smoke cigarettes.  He does admit to cocaine.   Past Medical History  Diagnosis Date  . COPD (chronic obstructive pulmonary disease)   . Stroke   . Hypertension     History reviewed. No pertinent past surgical history.  History reviewed. No pertinent family history.  History  Substance Use Topics  . Smoking status: Current Every Day Smoker  . Smokeless tobacco: Not on file  . Alcohol Use: Yes     Comment: liquor daily   admits to cocaine    Review of Systems  All other systems reviewed and are negative.     Allergies  Review of patient's allergies indicates no known allergies.  Home Medications   Current Outpatient Rx  Name  Route  Sig  Dispense  Refill   . lisinopril (PRINIVIL,ZESTRIL) 20 MG tablet   Oral   Take 20 mg by mouth daily.         Marland Kitchen         0     BP 177/106  Pulse 80  Temp(Src) 97.9 F (36.6 C) (Oral)  Resp 20  SpO2 100%  Physical Exam  Nursing note and vitals reviewed. Constitutional: He is oriented to person, place, and time. He appears well-developed and well-nourished.  HENT:  Head: Normocephalic and atraumatic.  Eyes: EOM are normal. Pupils are equal, round, and reactive to light.  Neck: Normal range of motion.  Cardiovascular: Regular rhythm and normal heart sounds.  Tachycardia present.   Pulmonary/Chest: Effort normal and breath sounds normal. No respiratory distress.  Abdominal: Soft. He exhibits no distension. There is no tenderness. There is no rebound and no guarding.  Genitourinary: Rectum normal.  Musculoskeletal: Normal range of motion.  Neurological: He is alert and oriented to person, place, and time.  Focal seizure activity of the left upper extremity.  No seizure activity noted of his bilateral lower extremities or his right upper extremity.   Left sided facial twitching. Weakness of LUE compared to right.   Skin: Skin is warm and dry.  Psychiatric: He has a normal mood and affect. Judgment normal.    ED Course  Procedures (including critical care time)   Date: 09/22/2012  Rate: 150  Rhythm: Sinus tachycardia  QRS Axis: normal  Intervals: normal  ST/T Wave abnormalities: Inferior lateral T wave peaking  Conduction Disutrbances: none  Narrative Interpretation:   Old EKG Reviewed: T wave changes appear new since prior EKG.  As his tachycardia     COORDINATION OF CARE: 12:08 PM Discussed treatment plan with pt at bedside and pt agreed to plan.   Labs Reviewed  CBC WITH DIFFERENTIAL - Abnormal; Notable for the following:    WBC 3.9 (*)    Neutrophils Relative % 79 (*)    Lymphs Abs 0.6 (*)    All other components within normal limits  COMPREHENSIVE METABOLIC PANEL - Abnormal;  Notable for the following:    Chloride 95 (*)    CO2 18 (*)    Glucose, Bld 50 (*)    Albumin 3.3 (*)    Alkaline Phosphatase 136 (*)    GFR calc non Af Amer 60 (*)    GFR calc Af Amer 69 (*)    All other components within normal limits  ETHANOL - Abnormal; Notable for the following:    Alcohol, Ethyl (B) 22 (*)    All other components within normal limits  URINE RAPID DRUG SCREEN (HOSP PERFORMED) - Abnormal; Notable for the following:    Cocaine POSITIVE (*)    All other components within normal limits  POCT I-STAT, CHEM 8 - Abnormal; Notable for the following:    Creatinine, Ser 1.40 (*)    Calcium, Ion 1.02 (*)    All other components within normal limits  TROPONIN I  PROTIME-INR   Ct Head Wo Contrast  09/22/2012   *RADIOLOGY REPORT*  Clinical Data: Tremors.  Altered level of consciousness.  CT HEAD WITHOUT CONTRAST  Technique:  Contiguous axial images were obtained from the base of the skull through the vertex without contrast.  Comparison: MR head 08/24/2011.  CT head 08/24/2011.  Findings: Large remote right MCA territory infarct was acute 5/13. Extensive encephalomalacia with gliosis affects the right frontotemporal region.  Advanced cerebral and cerebellar atrophy for the patient's age of 8.  Chronic microvascular ischemic change affects the periventricular and subcortical white matter.  There is no definite acute stroke or hemorrhage.  Calvarium intact.  Vascular calcification.  Mild sinus disease affects the left anterior ethmoid air cells.  Suspect  near complete opacification left maxillary sinus, incompletely evaluated.  There are  large cerumen plugs in the external canals. No mastoid fluid.  IMPRESSION: Chronic changes as described.  No acute intracranial abnormality.   Original Report Authenticated By: Davonna Belling, M.D.   I personally reviewed the imaging tests through PACS system I reviewed available ER/hospitalization records through the EMR    1. Focal motor seizure    2. Alcohol abuse   3. Cocaine abuse       Medications  0.9 %  sodium chloride infusion (0 mLs Intravenous Stopped 09/22/12 1412)    Followed by  0.9 %  sodium chloride infusion (not administered)  LORazepam (ATIVAN) injection 2 mg (2 mg Intravenous Given 09/22/12 1200)  levETIRAcetam (KEPPRA) 1,000 mg in sodium chloride 0.9 % 100 mL IVPB (0 mg Intravenous Stopped 09/22/12 1346)      MDM  Patient presents with what appears to be left upper extremity focal seizures.  Stat head CT.  Labs pending.  2 mg IV Ativan given on arrival.    2:32 PM Patient feels much better at this  time.  2 mg of IV Ativan the patient's focal left upper extremity seizure seemed to resolve quickly.  Patient was loaded with IV Keppra.  He'll need followup with neurology.  Patient be placed on 500 mg twice a day Keppra.  I've asked that the patient stop using cocaine his is likely exacerbating his symptoms.  Focal seizure due to his prior large right MCA stroke with ongoing extensive encephalomalacia involving the right frontotemporal region.  He understands to return to ER for new or worsening symptoms.  At time of discharge patient was 5 out of 5 strength in his bilateral upper lower extremity major muscle groups.  No seizure activity noted.      I personally performed the services described in this documentation, which was scribed in my presence. The recorded information has been reviewed and is accurate.    Lyanne Co, MD 09/22/12 1435

## 2012-09-22 NOTE — ED Notes (Signed)
Pt is here with father. Biological mother is incarcerated. Father just found out about child 3 mo ago and has had custody since.

## 2012-09-22 NOTE — ED Notes (Signed)
Back from ct. Pt having no tremors at this time.

## 2012-09-22 NOTE — ED Notes (Signed)
Pt over to have ct head done. Pt can not feel touch to Left side of body from lower leg to face. EDP aware. Tremors have subsided at this time. Denies trouble swallowing.

## 2012-09-22 NOTE — ED Notes (Signed)
Per EMS, called out for stroke. Pt passed stroke eval,irregular heart rate on EMS ekg. Pt has hx of heavy drinking and high blood pressure.

## 2012-09-22 NOTE — ED Notes (Signed)
Pt waiting on ride

## 2012-10-24 ENCOUNTER — Other Ambulatory Visit (HOSPITAL_COMMUNITY): Payer: Self-pay | Admitting: Internal Medicine

## 2012-10-24 DIAGNOSIS — R55 Syncope and collapse: Secondary | ICD-10-CM

## 2012-10-28 ENCOUNTER — Ambulatory Visit (HOSPITAL_COMMUNITY): Payer: Medicare HMO | Attending: Internal Medicine

## 2012-12-16 ENCOUNTER — Encounter (HOSPITAL_COMMUNITY): Payer: Self-pay | Admitting: *Deleted

## 2012-12-16 ENCOUNTER — Emergency Department (HOSPITAL_COMMUNITY): Payer: Medicare HMO

## 2012-12-16 ENCOUNTER — Observation Stay (HOSPITAL_COMMUNITY)
Admission: EM | Admit: 2012-12-16 | Discharge: 2012-12-17 | Disposition: A | Discharge status: Home / Self Care | Payer: Medicare HMO | Attending: Internal Medicine | Admitting: Internal Medicine

## 2012-12-16 DIAGNOSIS — J4489 Other specified chronic obstructive pulmonary disease: Secondary | ICD-10-CM | POA: Insufficient documentation

## 2012-12-16 DIAGNOSIS — Z9119 Patient's noncompliance with other medical treatment and regimen: Secondary | ICD-10-CM

## 2012-12-16 DIAGNOSIS — M109 Gout, unspecified: Secondary | ICD-10-CM

## 2012-12-16 DIAGNOSIS — G8190 Hemiplegia, unspecified affecting unspecified side: Secondary | ICD-10-CM

## 2012-12-16 DIAGNOSIS — G40109 Localization-related (focal) (partial) symptomatic epilepsy and epileptic syndromes with simple partial seizures, not intractable, without status epilepticus: Secondary | ICD-10-CM

## 2012-12-16 DIAGNOSIS — G8384 Todd's paralysis (postepileptic): Secondary | ICD-10-CM

## 2012-12-16 DIAGNOSIS — I69959 Hemiplegia and hemiparesis following unspecified cerebrovascular disease affecting unspecified side: Principal | ICD-10-CM | POA: Insufficient documentation

## 2012-12-16 DIAGNOSIS — R569 Unspecified convulsions: Secondary | ICD-10-CM

## 2012-12-16 DIAGNOSIS — I10 Essential (primary) hypertension: Secondary | ICD-10-CM | POA: Insufficient documentation

## 2012-12-16 DIAGNOSIS — F17200 Nicotine dependence, unspecified, uncomplicated: Secondary | ICD-10-CM | POA: Insufficient documentation

## 2012-12-16 DIAGNOSIS — F1010 Alcohol abuse, uncomplicated: Secondary | ICD-10-CM | POA: Diagnosis present

## 2012-12-16 DIAGNOSIS — I63511 Cerebral infarction due to unspecified occlusion or stenosis of right middle cerebral artery: Secondary | ICD-10-CM

## 2012-12-16 DIAGNOSIS — G8389 Other specified paralytic syndromes: Secondary | ICD-10-CM | POA: Insufficient documentation

## 2012-12-16 DIAGNOSIS — J449 Chronic obstructive pulmonary disease, unspecified: Secondary | ICD-10-CM | POA: Insufficient documentation

## 2012-12-16 DIAGNOSIS — F1020 Alcohol dependence, uncomplicated: Secondary | ICD-10-CM | POA: Insufficient documentation

## 2012-12-16 DIAGNOSIS — Z8739 Personal history of other diseases of the musculoskeletal system and connective tissue: Secondary | ICD-10-CM | POA: Diagnosis present

## 2012-12-16 DIAGNOSIS — F1410 Cocaine abuse, uncomplicated: Secondary | ICD-10-CM | POA: Diagnosis present

## 2012-12-16 DIAGNOSIS — Z720 Tobacco use: Secondary | ICD-10-CM | POA: Diagnosis present

## 2012-12-16 HISTORY — DX: Unspecified convulsions: R56.9

## 2012-12-16 HISTORY — DX: Alcohol abuse, uncomplicated: F10.10

## 2012-12-16 HISTORY — DX: Gout, unspecified: M10.9

## 2012-12-16 LAB — COMPREHENSIVE METABOLIC PANEL
Albumin: 3.1 g/dL — ABNORMAL LOW (ref 3.5–5.2)
BUN: 7 mg/dL (ref 6–23)
Calcium: 8.5 mg/dL (ref 8.4–10.5)
Chloride: 96 mEq/L (ref 96–112)
Creatinine, Ser: 0.93 mg/dL (ref 0.50–1.35)
Total Bilirubin: 1 mg/dL (ref 0.3–1.2)

## 2012-12-16 LAB — ETHANOL: Alcohol, Ethyl (B): <11 mg/dL (ref 0–11)

## 2012-12-16 LAB — URINALYSIS, ROUTINE W REFLEX MICROSCOPIC
Glucose, UA: NEGATIVE mg/dL
Leukocytes, UA: NEGATIVE
Nitrite: NEGATIVE
Specific Gravity, Urine: 1.01 (ref 1.005–1.030)
pH: 6 (ref 5.0–8.0)

## 2012-12-16 LAB — RAPID URINE DRUG SCREEN, HOSP PERFORMED
Amphetamines: NOT DETECTED
Cocaine: POSITIVE — AB
Opiates: NOT DETECTED

## 2012-12-16 LAB — POCT I-STAT, CHEM 8
Chloride: 100 mEq/L (ref 96–112)
Glucose, Bld: 151 mg/dL — ABNORMAL HIGH (ref 70–99)
HCT: 43 % (ref 39.0–52.0)
Hemoglobin: 14.6 g/dL (ref 13.0–17.0)
Potassium: 4 mEq/L (ref 3.5–5.1)
Sodium: 140 mEq/L (ref 135–145)

## 2012-12-16 LAB — DIFFERENTIAL
Basophils Relative: 0 % (ref 0–1)
Eosinophils Absolute: 0.1 10*3/uL (ref 0.0–0.7)
Eosinophils Relative: 1 % (ref 0–5)
Monocytes Absolute: 0.7 10*3/uL (ref 0.1–1.0)
Monocytes Relative: 14 % — ABNORMAL HIGH (ref 3–12)
Neutro Abs: 3.3 10*3/uL (ref 1.7–7.7)

## 2012-12-16 LAB — CBC
HCT: 40.3 % (ref 39.0–52.0)
Hemoglobin: 13.3 g/dL (ref 13.0–17.0)
MCH: 32.8 pg (ref 26.0–34.0)
MCHC: 33 g/dL (ref 30.0–36.0)
MCV: 99.3 fL (ref 78.0–100.0)

## 2012-12-16 LAB — URINE MICROSCOPIC-ADD ON

## 2012-12-16 LAB — GLUCOSE, CAPILLARY: Glucose-Capillary: 152 mg/dL — ABNORMAL HIGH (ref 70–99)

## 2012-12-16 LAB — TROPONIN I: Troponin I: <0.3 ng/mL (ref <0.30)

## 2012-12-16 LAB — POCT I-STAT TROPONIN I: Troponin i, poc: 0.01 ng/mL (ref 0.00–0.08)

## 2012-12-16 MED ORDER — ADULT MULTIVITAMIN W/MINERALS CH
1.0000 | ORAL_TABLET | Freq: Every day | ORAL | Status: DC
  Administered 2012-12-16 – 2012-12-17 (×2): 1 via ORAL
  Filled 2012-12-16 (×2): qty 1

## 2012-12-16 MED ORDER — ALUM & MAG HYDROXIDE-SIMETH 200-200-20 MG/5ML PO SUSP: 30.0000 mL | Freq: Four times a day (QID) | ORAL | Status: DC | PRN

## 2012-12-16 MED ORDER — SODIUM CHLORIDE 0.9 % IV SOLN: INTRAVENOUS | Status: DC

## 2012-12-16 MED ORDER — ACETAMINOPHEN 650 MG RE SUPP: 650.0000 mg | Freq: Four times a day (QID) | RECTAL | Status: DC | PRN

## 2012-12-16 MED ORDER — SENNOSIDES-DOCUSATE SODIUM 8.6-50 MG PO TABS: 1.0000 | ORAL_TABLET | Freq: Every evening | ORAL | Status: DC | PRN

## 2012-12-16 MED ORDER — FOLIC ACID 1 MG PO TABS
1.0000 mg | ORAL_TABLET | Freq: Every day | ORAL | Status: DC
  Administered 2012-12-16 – 2012-12-17 (×2): 1 mg via ORAL
  Filled 2012-12-16 (×2): qty 1

## 2012-12-16 MED ORDER — SODIUM CHLORIDE 0.9 % IV BOLUS (SEPSIS)
1000.0000 mL | Freq: Once | INTRAVENOUS | Status: AC
  Administered 2012-12-16: 1000 mL via INTRAVENOUS

## 2012-12-16 MED ORDER — VITAMIN B-1 100 MG PO TABS
100.0000 mg | ORAL_TABLET | Freq: Every day | ORAL | Status: DC
  Administered 2012-12-16 – 2012-12-17 (×2): 100 mg via ORAL
  Filled 2012-12-16 (×2): qty 1

## 2012-12-16 MED ORDER — ASPIRIN EC 325 MG PO TBEC
325.0000 mg | DELAYED_RELEASE_TABLET | Freq: Every day | ORAL | Status: DC
  Administered 2012-12-16 – 2012-12-17 (×2): 325 mg via ORAL
  Filled 2012-12-16 (×2): qty 1

## 2012-12-16 MED ORDER — LEVETIRACETAM 500 MG PO TABS: 500.0000 mg | ORAL_TABLET | Freq: Two times a day (BID) | ORAL | Status: AC

## 2012-12-16 MED ORDER — LEVETIRACETAM 500 MG PO TABS
500.0000 mg | ORAL_TABLET | Freq: Two times a day (BID) | ORAL | Status: DC
  Administered 2012-12-16 – 2012-12-17 (×2): 500 mg via ORAL
  Filled 2012-12-16 (×4): qty 1

## 2012-12-16 MED ORDER — LORAZEPAM 2 MG/ML IJ SOLN
1.0000 mg | Freq: Once | INTRAMUSCULAR | Status: AC
  Administered 2012-12-16: 1 mg via INTRAVENOUS
  Filled 2012-12-16: qty 1

## 2012-12-16 MED ORDER — ACETAMINOPHEN 325 MG PO TABS: 650.0000 mg | ORAL_TABLET | Freq: Four times a day (QID) | ORAL | Status: DC | PRN

## 2012-12-16 MED ORDER — SODIUM CHLORIDE 0.9 % IV SOLN
1000.0000 mg | INTRAVENOUS | Status: AC
  Administered 2012-12-16: 1000 mg via INTRAVENOUS
  Filled 2012-12-16 (×2): qty 10

## 2012-12-16 MED ORDER — LORAZEPAM 2 MG/ML IJ SOLN
INTRAMUSCULAR | Status: AC
  Filled 2012-12-16: qty 1

## 2012-12-16 MED ORDER — VITAMIN B-1 100 MG PO TABS: 100.0000 mg | ORAL_TABLET | Freq: Every day | ORAL | Status: DC

## 2012-12-16 MED ORDER — THIAMINE HCL 100 MG/ML IJ SOLN
100.0000 mg | Freq: Every day | INTRAMUSCULAR | Status: DC
  Filled 2012-12-16 (×2): qty 1

## 2012-12-16 MED ORDER — LEVETIRACETAM 500 MG PO TABS: 500.0000 mg | ORAL_TABLET | Freq: Two times a day (BID) | ORAL | Status: DC

## 2012-12-16 MED ORDER — THIAMINE HCL 100 MG/ML IJ SOLN
100.0000 mg | Freq: Once | INTRAMUSCULAR | Status: AC
  Administered 2012-12-16: 100 mg via INTRAVENOUS
  Filled 2012-12-16: qty 2

## 2012-12-16 MED ORDER — NICOTINE 21 MG/24HR TD PT24
21.0000 mg | MEDICATED_PATCH | Freq: Every day | TRANSDERMAL | Status: DC
  Administered 2012-12-16 – 2012-12-17 (×2): 21 mg via TRANSDERMAL
  Filled 2012-12-16 (×2): qty 1

## 2012-12-16 MED ORDER — LABETALOL HCL 5 MG/ML IV SOLN
10.0000 mg | Freq: Once | INTRAVENOUS | Status: DC
  Filled 2012-12-16: qty 4

## 2012-12-16 MED ORDER — SODIUM CHLORIDE 0.9 % IV SOLN
1.0000 g | Freq: Once | INTRAVENOUS | Status: AC
  Administered 2012-12-16: 1 g via INTRAVENOUS
  Filled 2012-12-16: qty 10

## 2012-12-16 MED ORDER — ONDANSETRON HCL 4 MG PO TABS: 4.0000 mg | ORAL_TABLET | Freq: Four times a day (QID) | ORAL | Status: DC | PRN

## 2012-12-16 MED ORDER — SODIUM CHLORIDE 0.9 % IJ SOLN
3.0000 mL | Freq: Two times a day (BID) | INTRAMUSCULAR | Status: DC
  Administered 2012-12-16 (×2): 3 mL via INTRAVENOUS

## 2012-12-16 MED ORDER — ONDANSETRON HCL 4 MG/2ML IJ SOLN: 4.0000 mg | Freq: Four times a day (QID) | INTRAMUSCULAR | Status: DC | PRN

## 2012-12-16 MED ORDER — ENOXAPARIN SODIUM 40 MG/0.4ML ~~LOC~~ SOLN
40.0000 mg | SUBCUTANEOUS | Status: DC
  Administered 2012-12-16: 40 mg via SUBCUTANEOUS
  Filled 2012-12-16 (×2): qty 0.4

## 2012-12-16 NOTE — Code Documentation (Signed)
67 yo bm brought in via [place] [facility] for stroke symptoms.  Per report pt was resting on couch & rolled off then had seizure-like activity.  Pt then noticed he could not move his left side & his cousin called EMS.  Cod estroke called [phone], pt arrival 0601, LKW 0520, EDP exam 0603, stroke team arrival 0550, neurologist arrival 0605, pt arrival in CT 0612, phlebotomist arrival 29. Pt given Ativan IV on arrival for shaking of LUE. S/S improved in route & pt close to baseline after CT scan.  NIH 8

## 2012-12-16 NOTE — ED Notes (Signed)
Pt comes to ED from [facility] for called code stroke.  Pt was report to have been last seen normal at 0520 by his cousin.  Per EMS, pt rolled off the floor and thought he might have been having a seizure.  Pt states history of stroke with some weakness to left side.  Per EMS, pt has had improvement in speech and muscle strength in his left side during transport.

## 2012-12-16 NOTE — ED Notes (Signed)
Pt ambulated in room. Pt states he is steady on his feet and denies use of assistive device at home. Pt is slow to move his left leg/foot. MD made aware.

## 2012-12-16 NOTE — Consult Note (Signed)
Referring Physician: Lavella Lemons    Chief Complaint: Left sided weakness  HPI: Jesus Lin is an 67 y.o. male with a history of a right MCA infarct in May of 2013, who was awake with family this morning.  While on the couch was noted to roll off onto the floor.  Questionable twitching was noted at that time.  Left sided was noted to be weak.  EMS was called and the patient was brought in as a code stroke.   Patient reports that at baseline he has some weakness on the left but is able to ambulate without assistance.  Patient reports he has had focal clonic activity in the past.    Date last known well: Date: 12/16/2012 Time last known well: Time: 05:20 tPA Given: No: Not felt to be a stroke  Past Medical History  Diagnosis Date  . COPD (chronic obstructive pulmonary disease)   . Stroke   . Hypertension     Past surgical history: None  Family history: Multiple family members with cancer  Social History:  reports that he has been smoking Cigarettes.  He has been smoking about 1.00 pack per day. He does not have any smokeless tobacco history on file. He reports that  drinks alcohol. He reports that he does not use illicit drugs.  Allergies: No Known Allergies  Medications: None  ROS: History obtained from the patient  General ROS: negative for - chills, fatigue, fever, night sweats, weight gain or weight loss Psychological ROS: negative for - behavioral disorder, hallucinations, memory difficulties, mood swings or suicidal ideation Ophthalmic ROS: negative for - blurry vision, double vision, eye pain or loss of vision ENT ROS: negative for - epistaxis, nasal discharge, oral lesions, sore throat, tinnitus or vertigo Allergy and Immunology ROS: negative for - hives or itchy/watery eyes Hematological and Lymphatic ROS: negative for - bleeding problems, bruising or swollen lymph nodes Endocrine ROS: negative for - galactorrhea, hair pattern changes, polydipsia/polyuria or temperature  intolerance Respiratory ROS: negative for - cough, hemoptysis, shortness of breath or wheezing Cardiovascular ROS: negative for - chest pain, dyspnea on exertion, edema or irregular heartbeat Gastrointestinal ROS: negative for - abdominal pain, diarrhea, hematemesis, nausea/vomiting or stool incontinence Genito-Urinary ROS: negative for - dysuria, hematuria, incontinence or urinary frequency/urgency Musculoskeletal ROS: negative for - joint swelling or muscular weakness Neurological ROS: as noted in HPI Dermatological ROS: negative for rash and skin lesion changes  Physical Examination: Blood pressure 180/116, temperature 97.7 F (36.5 C), temperature source Oral, resp. rate 32.  Neurologic Examination: Mental Status: Alert, oriented, thought content appropriate.  Speech fluent without evidence of aphasia.  Able to follow simple commands.  Left neglect Cranial Nerves: II: Discs flat bilaterally; LHH, pupils equal, round, reactive to light and accommodation III,IV, VI: ptosis not present, extra-ocular motions intact bilaterally V,VII: left facial droop, facial light touch sensation normal bilaterally VIII: hearing normal bilaterally IX,X: gag reflex present XI: bilateral shoulder shrug XII: midline tongue extension Motor: Right : Upper extremity   5/5    Left:     Upper extremity   3/5  Lower extremity   5/5     Lower extremity   4/5 Tone and bulk:normal tone throughout; no atrophy noted Sensory: Pinprick and light touch intact throughout, bilaterally Deep Tendon Reflexes: Symmetric throughout Plantars: Right: mute   Left: mute Cerebellar: Finger-to-nose intact on the right Gait: Unable to test CV: pulses palpable throughout   Laboratory Studies:  Basic Metabolic Panel:  Recent Labs Lab 12/16/12 0618  NA 140  K 4.0  CL 100  GLUCOSE 151*  BUN 5*  CREATININE 1.20    Liver Function Tests: No results found for this basename: AST, ALT, ALKPHOS, BILITOT, PROT, ALBUMIN,   in the last 168 hours No results found for this basename: LIPASE, AMYLASE,  in the last 168 hours No results found for this basename: AMMONIA,  in the last 168 hours  CBC:  Recent Labs Lab 12/16/12 0618  HGB 14.6  HCT 43.0    Cardiac Enzymes: No results found for this basename: CKTOTAL, CKMB, CKMBINDEX, TROPONINI,  in the last 168 hours  BNP: No components found with this basename: POCBNP,   CBG:  Recent Labs Lab 12/16/12 0605  GLUCAP 152*    Microbiology: No results found for this or any previous visit.  Coagulation Studies:  Recent Labs  12/16/12 0603  LABPROT 13.6  INR 1.06    Urinalysis: No results found for this basename: COLORURINE, APPERANCEUR, LABSPEC, PHURINE, GLUCOSEU, HGBUR, BILIRUBINUR, KETONESUR, PROTEINUR, UROBILINOGEN, NITRITE, LEUKOCYTESUR,  in the last 168 hours  Lipid Panel:    Component Value Date/Time   CHOL 151 08/25/2011 0531   TRIG 82 08/25/2011 0531   HDL 52 08/25/2011 0531   CHOLHDL 2.9 08/25/2011 0531   VLDL 16 08/25/2011 0531   LDLCALC 83 08/25/2011 0531    HgbA1C:  Lab Results  Component Value Date   HGBA1C 5.6 08/25/2011    Urine Drug Screen:     Component Value Date/Time   LABOPIA NONE DETECTED 09/22/2012 1319   COCAINSCRNUR POSITIVE* 09/22/2012 1319   LABBENZ NONE DETECTED 09/22/2012 1319   AMPHETMU NONE DETECTED 09/22/2012 1319   THCU NONE DETECTED 09/22/2012 1319   LABBARB NONE DETECTED 09/22/2012 1319    Alcohol Level: No results found for this basename: ETH,  in the last 168 hours  Other results: EKG: sinus rhythm at 112 bpm.  Imaging: Ct Head Wo Contrast  12/16/2012   CLINICAL DATA:  Code stroke.  EXAM: CT HEAD WITHOUT CONTRAST  TECHNIQUE: Contiguous axial images were obtained from the base of the skull through the vertex without intravenous contrast.  COMPARISON:  09/22/2012.  FINDINGS: Skull:No acute osseous abnormality. No lytic or blastic lesion.  Orbits: No acute abnormality.  Sinuses: Complete opacification of the  imaged upper left maxillary antrum. This is new from prior imaging which included this area (brain MRI 08/24/2011.  Brain: No evidence of acute abnormality, such as acute infarction, hemorrhage, hydrocephalus, or mass lesion/mass effect. Cerebral and cerebellar atrophy. There is a remote infarct in the right MCA territory with volume loss.  These results were called by telephone at the time of interpretation on 12/16/2012 at 6:29 AMto Dr Thad Ranger, who verbally acknowledged these results. .  IMPRESSION: 1. No evidence of acute intracranial disease. 2. Remote right MCA territory infarct. 3. Newly opacified left maxillary sinus.   Electronically Signed   By: Tiburcio Pea   On: 12/16/2012 06:32    Assessment: 67 y.o. male presenting with left sided weakness and left upper extremity clonic activity.  Clonic activity responded to Ativan.  Left sided weakness improved.  Unlikely an acute infarct.  Increased weakness likely post-ictal.    Stroke Risk Factors - hypertension and stroke in the past  Plan: 1. Keppra 1000mg  IV now with maintenance of 500mg  po BID 2. Seizure precautions 3.  ASA 325mg  daily 4.  Smoking cessation counseling  Thana Farr, MD [facility] [phone] 12/16/2012, 6:51 AM

## 2012-12-16 NOTE — ED Notes (Signed)
Admitting PA at the bedside.

## 2012-12-16 NOTE — ED Provider Notes (Signed)
Pt awake/alert, but Pt not at baseline, pt is slow to move his left LE.  Will admit for observation D/w dr Jerral Ralph, will admit   Jesus Gaskins, MD 12/16/12 1020

## 2012-12-16 NOTE — ED Provider Notes (Signed)
Date: 12/16/2012 0631am  Rate: 112  Rhythm: sinus tachycardia  QRS Axis: left  Intervals: normal  ST/T Wave abnormalities: nonspecific ST changes  Conduction Disutrbances:none    Joya Gaskins, MD 12/16/12 1022

## 2012-12-16 NOTE — ED Notes (Signed)
Attempted report 

## 2012-12-16 NOTE — H&P (Signed)
[facility] History and Physical  Jesus Lin:[id number] DOB: 04-24-1945 DOA: 12/16/2012  Referring physician: Dr. Bebe Shaggy PCP: Avon Gully, MD  Specialists:  Chief Complaint: left arm weakness  HPI: Jesus Lin is a 67 y.o. male  With a history of COPD, previous CVA, HTN, chronic alcoholism, cocaine use, and Gout presents with a chief complaint of left arm weakness that started last evening (9/14) around 5pm while sitting at his cousins house.While on the couch was noted to roll off onto the floor. Questionable twitching was noted at that time. Left sided was noted to be weak.  After trying to "sleep it off" without resolution this morning, patient arrived via EMS.  He does report similar episodes in the past for which he sought medical care at [facility] ER approximately 3 months ago.  At that time he was prescribed Keppra 500mg  BID but patient has not been taking medication.  He did not try anything to relieve the symptoms and did not recall any aggravating factors. He did not notice numbness or tingling in the extremity but mild wrist flexing muscle twitch was noticed.   Review of Systems: The patient denies anorexia, fever, weight loss, headache, vision loss, decreased hearing, hoarseness, chest pain, syncope, dyspnea on exertion, peripheral edema, balance deficits, hemoptysis, abdominal pain, severe indigestion/heartburn, hematuria, incontinence, transient blindness, unusual weight change, abnormal bleeding, enlarged lymph nodes.   Past Medical History  Diagnosis Date  . COPD (chronic obstructive pulmonary disease)   . Stroke   . Hypertension   . Gout   . Alcohol abuse   . Seizures    History reviewed. No pertinent past surgical history.  Social History:  reports that he has been smoking Cigarettes.  He has been smoking about 1.00 pack per day. He does not have any smokeless tobacco history on file. He reports that  drinks 1 pint of vodka daily. He reports  occasional cocaine use. Patient lives at home in [place] with his sister. Patient does not work as he is on disability.  No Known Allergies  Family History Mother died at age 71 from brain aneurysm Father died from prostate cancer   Prior to Admission medications   Medication Sig Start Date End Date Taking? Authorizing Provider  levETIRAcetam (KEPPRA) 500 MG tablet Take 1 tablet (500 mg total) by mouth 2 (two) times daily. 09/22/12   Lyanne Co, MD  levETIRAcetam (KEPPRA) 500 MG tablet Take 1 tablet (500 mg total) by mouth 2 (two) times daily. 12/16/12   Brandt Loosen, MD  lisinopril (PRINIVIL,ZESTRIL) 20 MG tablet Take 20 mg by mouth daily.    Historical Provider, MD   Physical Exam: Filed Vitals:   12/16/12 1157  BP: 163/104  Pulse: 72  Temp: 98.6 F (37 C)  Resp: 18     General:  Thin elderly african [place] male that is lying comfortably in the ED.  Eyes: PERRLA, extraocular movements intact  ENT: Multiple missing teeth  Neck: Single enlarged tonsillar lymph node on right side  Cardiovascular: Mildly tachycardic  Respiratory: Decreased lung sounds throughout without wheezes, rales, rhonchi.  Abdomen: Soft non-tender, no distention, bowel sounds present throughout  Skin: Large scar through middle of abdomen from previous injury.  Musculoskeletal: Left sided weakness 3/5 with MMT compared bilaterally with major deficits seen in left arm forward flexion and plantarflexion.    Psychiatric: Alert and oriented, slightly sleepy, poorly groomed, cooperative.  Neurologic: Left sided weakness, CN II-XII intact but with weakness in CN XI- Spinal Accesory  Labs on Admission:  Basic Metabolic Panel:  Recent Labs Lab 12/16/12 0603 12/16/12 0618  NA 140 140  K 4.1 4.0  CL 96 100  CO2 20  --   GLUCOSE 151* 151*  BUN 7 5*  CREATININE 0.93 1.20  CALCIUM 8.5  --    Liver Function Tests:  Recent Labs Lab 12/16/12 0603  AST 44*  ALT 13  ALKPHOS QUANTITY NOT  SUFFICIENT, UNABLE TO PERFORM TEST  BILITOT 1.0  PROT 7.5  ALBUMIN 3.1*   CBC:  Recent Labs Lab 12/16/12 0603 12/16/12 0618  WBC 5.1  --   NEUTROABS 3.3  --   HGB 13.3 14.6  HCT 40.3 43.0  MCV 99.3  --   PLT 231  --    Cardiac Enzymes:  Recent Labs Lab 12/16/12 0603  TROPONINI <0.30    CBG:  Recent Labs Lab 12/16/12 0605  GLUCAP 152*    Radiological Exams on Admission: Ct Head Wo Contrast  12/16/2012   CLINICAL DATA:  Code stroke.  EXAM: CT HEAD WITHOUT CONTRAST  TECHNIQUE: Contiguous axial images were obtained from the base of the skull through the vertex without intravenous contrast.  COMPARISON:  09/22/2012.  FINDINGS: Skull:No acute osseous abnormality. No lytic or blastic lesion.  Orbits: No acute abnormality.  Sinuses: Complete opacification of the imaged upper left maxillary antrum. This is new from prior imaging which included this area (brain MRI 08/24/2011.  Brain: No evidence of acute abnormality, such as acute infarction, hemorrhage, hydrocephalus, or mass lesion/mass effect. Cerebral and cerebellar atrophy. There is a remote infarct in the right MCA territory with volume loss.  These results were called by telephone at the time of interpretation on 12/16/2012 at 6:29 AMto Dr Thad Ranger, who verbally acknowledged these results. .  IMPRESSION: 1. No evidence of acute intracranial disease. 2. Remote right MCA territory infarct. 3. Newly opacified left maxillary sinus.   Electronically Signed   By: Tiburcio Pea   On: 12/16/2012 06:32    EKG: Sinus Tachycardia  Assessment/Plan Active Problems:   Alcohol abuse   Acute gout   Tobacco abuse   Other convulsions   Hemiplegia, unspecified, affecting nondominant side   Seizures   Cocaine abuse   1. Seizures  -? EtOH or polysubstance abuse pathology  -Neuro consulted  -On Keppra and Ativan Inpatient  -Admit for observation  -q shift Neuro checks  2. Left sided weakness  -? Todd's paralysis  -PT  Eval -re-assess later-if still weak-may need a MRI Brain to rule out a new CVA -keep on ASA  3. Alcoholism  -Last drink was 9/14   -CIWA protocol  -SW consult  4. Hypertension  -Off of Lisinopril   -Currently within normal limits  5.   Cocaine Abuse  -Positive via UDS  -Counseled on quitting  -SW Consult   Code Status: Full Family Communication: PA Student briefly spoke with son, who lives in [place], and informed him we would call back after more information was gathered.  Son= Dougles Kimmey [phone] Disposition Plan: Observation   Time spent:  Cathi Roan. PAS-2, Physician Assistant Student [place] [facility] Pager [phone]  If 7PM-7AM, please contact night-coverage [email or web address] Password [facility] 12/16/2012, 12:12 PM  Attending Patient seen and examined, agree with assessment and plan. Left arm weakness after a presumed seizure, seems to improving, during my evaluation-patient claimed very close to usual baseline. Admit for close monitoring, continue with ASA, if still weak then may need to repeat  MRI Brain.  Windell Norfolk MD

## 2012-12-16 NOTE — ED Provider Notes (Signed)
CSN: [id number]     Arrival date & time 12/16/12  0601 History   First MD Initiated Contact with Patient 12/16/12 [id number]     Chief Complaint  Patient presents with  . Code Stroke   (Consider location/radiation/quality/duration/timing/severity/associated sxs/prior Treatment) HPI This patient is a middle-aged man with a self-reported history of seizures, chronic and daily alcohol abuse , history of stroke, hypertension and COPD.  He was brought to the emergency department by EMS as a code stroke.  Paramedics report having obtained history from the patient's cousin. Cousin states that the patient was laying on a couch. They were watching television together. The patient rolled off of the couch and had what appeared to be seizure-like activity. However, when the paramedics arrived, the patient was alert and responsive. He was noted to have a flaccid left leg and arm.  The patient says he has had seizure activity in the past. He is noted to have contracted left forearm and wrist with rythmic jerking of this region. He says he has had this problem in the past. He denies headache, cp and sob.   The patient says his last alcoholic drink was approximately    Past Medical History  Diagnosis Date  . COPD (chronic obstructive pulmonary disease)   . Stroke   . Hypertension   . Gout    History reviewed. No pertinent past surgical history. No family history on file. History  Substance Use Topics  . Smoking status: Current Every Day Smoker -- 1.00 packs/day    Types: Cigarettes  . Smokeless tobacco: Not on file  . Alcohol Use: Yes     Comment: liquor daily    Review of Systems 10 point review of systems obtained and is negative with the exception of symptoms noted above.  Allergies  Review of patient's allergies indicates no known allergies.  Home Medications   Current Outpatient Rx  Name  Route  Sig  Dispense  Refill  . levETIRAcetam (KEPPRA) 500 MG tablet   Oral   Take 1 tablet (500  mg total) by mouth 2 (two) times daily.   60 tablet   0   . lisinopril (PRINIVIL,ZESTRIL) 20 MG tablet   Oral   Take 20 mg by mouth daily.          BP 128/80  Pulse 104  Temp(Src) 97.7 F (36.5 C) (Oral)  Resp 22  SpO2 95% Physical Exam Gen: well developed and well nourished appearing, chronically ill-appearing, disheveled. Head: NCAT Eyes: PERL, EOMI Nose: no epistaixis or rhinorrhea Mouth/throat: mucosa is moist and pink, poor dentition, poor oral hygeine. Neck: supple, no stridor Lungs: CTA B, no wheezing, rhonchi or rales CV: rapid and regular, no murmur Abd: soft, notender, nondistended Back: kyphosis Skin: warm and dry Neuro: CN ii-xii grossly intact, patient's left forearm and wrist are contracted, rythmic clonic activity of the left forearm with clonus present on ROM.  Psyche: alert and oriented  ED Course  Procedures (including critical care time) Results for orders placed during the hospital encounter of 12/16/12 (from the past 24 hour(s))  PROTIME-INR     Status: None   Collection Time    12/16/12  6:03 AM      Result Value Range   Prothrombin Time 13.6  11.6 - 15.2 seconds   INR 1.06  0.00 - 1.49  APTT     Status: None   Collection Time    12/16/12  6:03 AM      Result Value Range  aPTT 24  24 - 37 seconds  CBC     Status: Abnormal   Collection Time    12/16/12  6:03 AM      Result Value Range   WBC 5.1  4.0 - 10.5 K/uL   RBC 4.06 (*) 4.22 - 5.81 MIL/uL   Hemoglobin 13.3  13.0 - 17.0 g/dL   HCT 82.9  56.2 - 13.0 %   MCV 99.3  78.0 - 100.0 fL   MCH 32.8  26.0 - 34.0 pg   MCHC 33.0  30.0 - 36.0 g/dL   RDW 86.5 (*) 78.4 - 69.6 %   Platelets 231  150 - 400 K/uL  DIFFERENTIAL     Status: Abnormal   Collection Time    12/16/12  6:03 AM      Result Value Range   Neutrophils Relative % 64  43 - 77 %   Neutro Abs 3.3  1.7 - 7.7 K/uL   Lymphocytes Relative 21  12 - 46 %   Lymphs Abs 1.1  0.7 - 4.0 K/uL   Monocytes Relative 14 (*) 3 - 12 %    Monocytes Absolute 0.7  0.1 - 1.0 K/uL   Eosinophils Relative 1  0 - 5 %   Eosinophils Absolute 0.1  0.0 - 0.7 K/uL   Basophils Relative 0  0 - 1 %   Basophils Absolute 0.0  0.0 - 0.1 K/uL  TROPONIN I     Status: None   Collection Time    12/16/12  6:03 AM      Result Value Range   Troponin I <0.30  <0.30 ng/mL  GLUCOSE, CAPILLARY     Status: Abnormal   Collection Time    12/16/12  6:05 AM      Result Value Range   Glucose-Capillary 152 (*) 70 - 99 mg/dL  POCT I-STAT TROPONIN I     Status: None   Collection Time    12/16/12  6:16 AM      Result Value Range   Troponin i, poc 0.01  0.00 - 0.08 ng/mL   Comment 3           POCT I-STAT, CHEM 8     Status: Abnormal   Collection Time    12/16/12  6:18 AM      Result Value Range   Sodium 140  135 - 145 mEq/L   Potassium 4.0  3.5 - 5.1 mEq/L   Chloride 100  96 - 112 mEq/L   BUN 5 (*) 6 - 23 mg/dL   Creatinine, Ser 2.95  0.50 - 1.35 mg/dL   Glucose, Bld 284 (*) 70 - 99 mg/dL   Calcium, Ion 1.32 (*) 1.13 - 1.30 mmol/L   TCO2 22  0 - 100 mmol/L   Hemoglobin 14.6  13.0 - 17.0 g/dL   HCT 44.0  10.2 - 72.5 %  URINALYSIS, ROUTINE W REFLEX MICROSCOPIC     Status: Abnormal   Collection Time    12/16/12  6:39 AM      Result Value Range   Color, Urine YELLOW  YELLOW   APPearance CLEAR  CLEAR   Specific Gravity, Urine 1.010  1.005 - 1.030   pH 6.0  5.0 - 8.0   Glucose, UA NEGATIVE  NEGATIVE mg/dL   Hgb urine dipstick TRACE (*) NEGATIVE   Bilirubin Urine NEGATIVE  NEGATIVE   Ketones, ur NEGATIVE  NEGATIVE mg/dL   Protein, ur 30 (*) NEGATIVE mg/dL   Urobilinogen, UA 1.0  0.0 - 1.0 mg/dL   Nitrite NEGATIVE  NEGATIVE   Leukocytes, UA NEGATIVE  NEGATIVE  URINE MICROSCOPIC-ADD ON     Status: Abnormal   Collection Time    12/16/12  6:39 AM      Result Value Range   WBC, UA 0-2  <3 WBC/hpf   RBC / HPF 0-2  <3 RBC/hpf   Bacteria, UA RARE  RARE   Casts HYALINE CASTS (*) NEGATIVE    Imaging Review Ct Head Wo Contrast  12/16/2012    CLINICAL DATA:  Code stroke.  EXAM: CT HEAD WITHOUT CONTRAST  TECHNIQUE: Contiguous axial images were obtained from the base of the skull through the vertex without intravenous contrast.  COMPARISON:  09/22/2012.  FINDINGS: Skull:No acute osseous abnormality. No lytic or blastic lesion.  Orbits: No acute abnormality.  Sinuses: Complete opacification of the imaged upper left maxillary antrum. This is new from prior imaging which included this area (brain MRI 08/24/2011.  Brain: No evidence of acute abnormality, such as acute infarction, hemorrhage, hydrocephalus, or mass lesion/mass effect. Cerebral and cerebellar atrophy. There is a remote infarct in the right MCA territory with volume loss.  These results were called by telephone at the time of interpretation on 12/16/2012 at 6:29 AMto Dr Thad Ranger, who verbally acknowledged these results. .  IMPRESSION: 1. No evidence of acute intracranial disease. 2. Remote right MCA territory infarct. 3. Newly opacified left maxillary sinus.   Electronically Signed   By: Tiburcio Pea   On: 12/16/2012 06:32   EKG: nsr, no acute ischemic changes, normal intervals, normal axis.   MDM   1. Other convulsions   2. Hemiplegia, unspecified, affecting nondominant side   3.  Alcohol abuse 4. Cocaine abuse.   Patient presents with seizure and Todd's paralysis. He has been evaluated by Dr. Ferne Coe who recommends that the patient be loaded with Keppra and, if Todd's paralysis does not resolve to the point that the patient can ambulate without assistance by the time his Keppra load is complete, she recommends admission for very brief observation without plan for extensive work up as inpatient.     Brandt Loosen, MD 12/16/12 0730

## 2012-12-17 DIAGNOSIS — G40109 Localization-related (focal) (partial) symptomatic epilepsy and epileptic syndromes with simple partial seizures, not intractable, without status epilepticus: Secondary | ICD-10-CM

## 2012-12-17 LAB — BASIC METABOLIC PANEL
BUN: 6 mg/dL (ref 6–23)
GFR calc Af Amer: >90 mL/min (ref >90)
GFR calc non Af Amer: 84 mL/min — ABNORMAL LOW (ref >90)
Potassium: 3.5 mEq/L (ref 3.5–5.1)

## 2012-12-17 MED ORDER — AMLODIPINE BESYLATE 2.5 MG PO TABS: 2.5000 mg | ORAL_TABLET | Freq: Every day | ORAL | Status: DC

## 2012-12-17 MED ORDER — ADULT MULTIVITAMIN W/MINERALS CH: 1.0000 | ORAL_TABLET | Freq: Every day | ORAL | Status: DC

## 2012-12-17 NOTE — Progress Notes (Signed)
Patient discharged home with sister.  IV removed and paperwork completed.  Lance Bosch, RN

## 2012-12-17 NOTE — Evaluation (Signed)
Physical Therapy Evaluation Patient Details Name: Jesus Lin MRN: [id number] DOB: [birth year] Today's Date: 12/17/2012 Time: 6962-9528 PT Time Calculation (min): 10 min  PT Assessment / Plan / Recommendation History of Present Illness  67 y.o. male admitted to [facility] on 12/16/12 with a history of COPD, previous CVA, HTN, chronic alcoholism, cocaine use, and Gout presents with a chief complaint of left arm weakness.    Clinical Impression  Pt presents today close to his baseline level of functioning.  Pt reporting L knee pain and demonstrates antalgic gait pattern.  He would benefit from acute PT to help address knee pain and gait deficits if he stays here longer than today, but since he is so close to baseline with no h/o falls I do not recommend PT f/u at discharge and no DME needs as well.  PT to follow acutely for deficits listed below.      PT Assessment  Patient needs continued PT services    Follow Up Recommendations  No PT follow up;Supervision - Intermittent    Does the patient have the potential to tolerate intense rehabilitation     NA  Barriers to Discharge Other (comment) (None) None    Equipment Recommendations  None recommended by PT    Recommendations for Other Services   NA  Frequency Min 3X/week    Precautions / Restrictions Restrictions Weight Bearing Restrictions: No   Pertinent Vitals/Pain See vitals flow sheet.       Mobility  Bed Mobility Bed Mobility: Supine to Sit;Sitting - Scoot to Edge of Bed;Sit to Supine Supine to Sit: 7: Independent Sitting - Scoot to Edge of Bed: 7: Independent Sit to Supine: 7: Independent Transfers Transfers: Sit to Stand;Stand to Sit Sit to Stand: 6: Modified independent (Device/Increase time);With upper extremity assist;From bed Stand to Sit: 6: Modified independent (Device/Increase time);With upper extremity assist;To bed Details for Transfer Assistance: slow, and used hands to stabilize during  transitions Ambulation/Gait Ambulation/Gait Assistance: 6: Modified independent (Device/Increase time) Ambulation Distance (Feet): 400 Feet Assistive device: None Ambulation/Gait Assistance Details: pt with slow, antalgic gait pattern, reports this is his baseline walking pattern.   Gait Pattern: Step-through pattern;Antalgic Stairs: Yes Stairs Assistance: 6: Modified independent (Device/Increase time) Stair Management Technique: Two rails;Forwards;Alternating pattern Number of Stairs: 10        PT Diagnosis: Difficulty walking;Abnormality of gait;Generalized weakness;Acute pain  PT Problem List: Decreased strength;Decreased activity tolerance;Decreased balance;Decreased mobility;Pain PT Treatment Interventions: DME instruction;Gait training;Stair training;Functional mobility training;Therapeutic activities;Therapeutic exercise;Balance training;Neuromuscular re-education;Patient/family education;Modalities     PT Goals(Current goals can be found in the care plan section) Acute Rehab PT Goals Patient Stated Goal: to go home today "would be nice" PT Goal Formulation: With patient Time For Goal Achievement: 12/31/12 Potential to Achieve Goals: Good  Visit Information  Last PT Received On: 12/17/12 Assistance Needed: +1 History of Present Illness: 67 y.o. male admitted to [facility] on 12/16/12 with a history of COPD, previous CVA, HTN, chronic alcoholism, cocaine use, and Gout presents with a chief complaint of left arm weakness.         Prior Functioning  Home Living Family/patient expects to be discharged to:: Private residence Living Arrangements: Other relatives (sister) Available Help at Discharge: Family;Available PRN/intermittently (sister works day shift) Type of Home: House Home Access: Stairs to enter Secretary/administrator of Steps: 4 Entrance Stairs-Rails: Right;Left Home Layout: Two level (pt lives downstairs) Alternate Level Stairs-Number of Steps: 14 Alternate Level  Stairs-Rails: Right Home Equipment: None Additional Comments: pt reports no h/o falls,  gout in left first toe, and L knee pain with gait.  Prior Function Level of Independence: Independent Communication Communication: No difficulties Dominant Hand: Right    Cognition  Cognition Arousal/Alertness: Awake/alert Behavior During Therapy: WFL for tasks assessed/performed Overall Cognitive Status: Within Functional Limits for tasks assessed (not specifically tested)    Extremity/Trunk Assessment Upper Extremity Assessment Upper Extremity Assessment: Overall WFL for tasks assessed (not specifically tested, but he could raise left arm ) Lower Extremity Assessment Lower Extremity Assessment: Generalized weakness;LLE deficits/detail LLE Deficits / Details: lef leg strength limited by left knee pain with gait.    Balance Balance Balance Assessed: Yes Static Sitting Balance Static Sitting - Balance Support: No upper extremity supported;Feet supported Static Sitting - Level of Assistance: 7: Independent Dynamic Standing Balance Dynamic Standing - Balance Support: No upper extremity supported Dynamic Standing - Level of Assistance: 6: Modified independent (Device/Increase time) Dynamic Standing - Comments: toileting and washing hands at sink, reaching for flush and soap/towels High Level Balance High Level Balance Activites: Direction changes;Turns;Other (comment) High Level Balance Comments: picking up objects from floor mod I due to slow and cautious  End of Session PT - End of Session Activity Tolerance: Patient tolerated treatment well Patient left: in bed;with call bell/phone within reach;with bed alarm set  GP Functional Assessment Tool Used: assist levels Functional Limitation: Mobility: Walking and moving around Mobility: Walking and Moving Around Current Status (W1191): At least 1 percent but less than 20 percent impaired, limited or restricted Mobility: Walking and Moving Around  Goal Status [phone]): At least 1 percent but less than 20 percent impaired, limited or restricted Mobility: Walking and Moving Around Discharge Status [phone]): At least 1 percent but less than 20 percent impaired, limited or restricted   Garth Diffley B. Dashaun Onstott, PT, DPT [phone]   12/17/2012, 12:52 PM

## 2012-12-17 NOTE — Discharge Summary (Signed)
Physician Discharge Summary  Jesus Lin ZOX:[id number] DOB: 1945/08/03 DOA: 12/16/2012  PCP: Avon Gully, MD  Admit date: 12/16/2012 Discharge date: 12/17/2012  Time spent: >30 minutes  minutes  Recommendations for Outpatient Follow-up:   -f/u with PCP in 1-52 weeks  -f/u with neurology in 2-3 weeks  Discharge Diagnoses:  Active Problems:   Alcohol abuse   Acute gout   Tobacco abuse   Other convulsions   Hemiplegia, unspecified, affecting nondominant side   Seizures   Cocaine abuse   Discharge Condition: stable   Diet recommendation: heart healthy   Filed Weights   12/16/12 1157  Weight: 60 kg (132 lb 4.4 oz)    History of present illness:  67 y.o. male with a history of COPD, previous CVA, HTN, chronic alcoholism, cocaine use, and Gout presents with a chief complaint of left arm weakness that started last evening (9/14) around 5pm while sitting at his cousins house   Hospital Course:  Patient is admitted with possible post ictal state related symptoms; CT head: No evidence of acute intracranial disease. Symptoms resolved; no new events during hospitalization; ? Medication non adherence; neuro exam non focal;  -Back to baseline and from a neuro standpoint he can be discharge today and arrange neurology follow up as outpatient in 2-3 weeks  -patient d/w at length to stop using drugs;  -he is also started on low dose amlodipine for htn; reccommended to f/u with PCP in 1 week to medication titration if needed    Procedures: Head CT  Consultations:  Neurology   Discharge Exam: Filed Vitals:   12/17/12 0600  BP: 160/89  Pulse: 67  Temp: 98.3 F (36.8 C)  Resp: 18    General: alert  Cardiovascular: S1, S2 rrr Respiratory:  CTA bl   Discharge Instructions  Discharge Orders   Future Orders Complete By Expires   Diet - low sodium heart healthy  As directed    Discharge instructions  As directed    Comments:     -Follow up with neurology in 2-3  weeks -follow up with primary care doctor inn 1-2 weeks   Increase activity slowly  As directed        Medication List         amLODipine 2.5 MG tablet  Commonly known as:  NORVASC  Take 1 tablet (2.5 mg total) by mouth daily.     ibuprofen 200 MG tablet  Commonly known as:  ADVIL,MOTRIN  Take 400 mg by mouth every 6 (six) hours as needed for pain. For gout symptoms     levETIRAcetam 500 MG tablet  Commonly known as:  KEPPRA  Take 1 tablet (500 mg total) by mouth 2 (two) times daily.     multivitamin with minerals Tabs tablet  Take 1 tablet by mouth daily.       No Known Allergies     Follow-up Information   Follow up with [facility]. Schedule an appointment as soon as possible for a visit in 2 days.   Contact information:   [address] [place] [address] [phone]      Follow up with [facility], MD In 1 week.   Specialty:  Internal Medicine   Contact information:   [address] [place] [address] [phone]        The results of significant diagnostics from this hospitalization (including imaging, microbiology, ancillary and laboratory) are listed below for reference.    Significant Diagnostic Studies: Ct Head Wo  Contrast  12/16/2012   CLINICAL DATA:  Code stroke.  EXAM: CT HEAD WITHOUT CONTRAST  TECHNIQUE: Contiguous axial images were obtained from the base of the skull through the vertex without intravenous contrast.  COMPARISON:  09/22/2012.  FINDINGS: Skull:No acute osseous abnormality. No lytic or blastic lesion.  Orbits: No acute abnormality.  Sinuses: Complete opacification of the imaged upper left maxillary antrum. This is new from prior imaging which included this area (brain MRI 08/24/2011.  Brain: No evidence of acute abnormality, such as acute infarction, hemorrhage, hydrocephalus, or mass lesion/mass effect. Cerebral and cerebellar atrophy. There is a remote infarct in the right MCA territory  with volume loss.  These results were called by telephone at the time of interpretation on 12/16/2012 at 6:29 AMto Dr Thad Ranger, who verbally acknowledged these results. .  IMPRESSION: 1. No evidence of acute intracranial disease. 2. Remote right MCA territory infarct. 3. Newly opacified left maxillary sinus.   Electronically Signed   By: Tiburcio Pea   On: 12/16/2012 06:32    Microbiology: No results found for this or any previous visit (from the past 240 hour(s)).   Labs: Basic Metabolic Panel:  Recent Labs Lab 12/16/12 0603 12/16/12 0618 12/17/12 0415  NA 140 140 140  K 4.1 4.0 3.5  CL 96 100 98  CO2 20  --  29  GLUCOSE 151* 151* 91  BUN 7 5* 6  CREATININE 0.93 1.20 0.96  CALCIUM 8.5  --  8.5   Liver Function Tests:  Recent Labs Lab 12/16/12 0603  AST 44*  ALT 13  ALKPHOS QUANTITY NOT SUFFICIENT, UNABLE TO PERFORM TEST  BILITOT 1.0  PROT 7.5  ALBUMIN 3.1*   No results found for this basename: LIPASE, AMYLASE,  in the last 168 hours No results found for this basename: AMMONIA,  in the last 168 hours CBC:  Recent Labs Lab 12/16/12 0603 12/16/12 0618  WBC 5.1  --   NEUTROABS 3.3  --   HGB 13.3 14.6  HCT 40.3 43.0  MCV 99.3  --   PLT 231  --    Cardiac Enzymes:  Recent Labs Lab 12/16/12 0603  TROPONINI <0.30   BNP: BNP (last 3 results) No results found for this basename: PROBNP,  in the last 8760 hours CBG:  Recent Labs Lab 12/16/12 0605  GLUCAP 152*       Signed:  Jonette Mate N  [facility] 12/17/2012, 10:29 AM

## 2012-12-17 NOTE — Progress Notes (Signed)
NEURO HOSPITALIST PROGRESS NOTE   SUBJECTIVE:                                                                                                                        Jesus Lin tells me that he feels normal and back to his usual state of health. No further paroxysmal events concerning for seizures noted. On keppra 500 mg BID.    OBJECTIVE:                                                                                                                           Vital signs in last 24 hours: Temp:  [97.1 F (36.2 C)-99.2 F (37.3 C)] 98.3 F (36.8 C) (09/16 0600) Pulse Rate:  [67-91] 67 (09/16 0600) Resp:  [18] 18 (09/16 0600) BP: (148-164)/(88-104) 160/89 mmHg (09/16 0600) SpO2:  [98 %-100 %] 99 % (09/16 0600) Weight:  [60 kg (132 lb 4.4 oz)] 60 kg (132 lb 4.4 oz) (09/15 1157)  Intake/Output from previous day: 09/15 0701 - 09/16 0700 In: 1110 [I.V.:1110] Out: -  Intake/Output this shift:   Nutritional status: General  Past Medical History  Diagnosis Date  . COPD (chronic obstructive pulmonary disease)   . Stroke   . Hypertension   . Gout   . Alcohol abuse   . Seizures     Neurologic Exam:  Mental Status: Alert, oriented, thought content appropriate.  Speech fluent without evidence of aphasia.  Able to follow 3 step commands without difficulty. Cranial Nerves: II: Discs flat bilaterally; Visual fields grossly normal, pupils equal, round, reactive to light and accommodation III,IV, VI: ptosis not present, extra-ocular motions intact bilaterally V,VII: smile symmetric, facial light touch sensation normal bilaterally VIII: hearing normal bilaterally IX,X: gag reflex present XI: bilateral shoulder shrug XII: midline tongue extension Motor: Moves all limbs, perhaps mild weakness left hemibody. Tone and bulk:normal tone throughout; no atrophy noted Sensory: Pinprick and light touch intact throughout, bilaterally Deep Tendon  Reflexes:  Right: Upper Extremity   Left: Upper extremity   biceps (C-5 to C-6) 2/4   biceps (C-5 to C-6) 2/4 tricep (C7) 2/4    triceps (C7) 2/4 Brachioradialis (C6) 2/4  Brachioradialis (C6) 2/4  Lower Extremity Lower Extremity  quadriceps (L-2  to L-4) 2/4   quadriceps (L-2 to L-4) 2/4 Achilles (S1) 2/4   Achilles (S1) 2/4  Plantars: Right: downgoing   Left: downgoing Cerebellar: normal finger-to-nose,  normal heel-to-shin test Gait:  No ataxia. CV: pulses palpable throughout    Lab Results: Lab Results  Component Value Date/Time   CHOL 151 08/25/2011  5:31 AM   Lipid Panel No results found for this basename: CHOL, TRIG, HDL, CHOLHDL, VLDL, LDLCALC,  in the last 72 hours  Studies/Results: Ct Head Wo Contrast  12/16/2012   CLINICAL DATA:  Code stroke.  EXAM: CT HEAD WITHOUT CONTRAST  TECHNIQUE: Contiguous axial images were obtained from the base of the skull through the vertex without intravenous contrast.  COMPARISON:  09/22/2012.  FINDINGS: Skull:No acute osseous abnormality. No lytic or blastic lesion.  Orbits: No acute abnormality.  Sinuses: Complete opacification of the imaged upper left maxillary antrum. This is new from prior imaging which included this area (brain MRI 08/24/2011.  Brain: No evidence of acute abnormality, such as acute infarction, hemorrhage, hydrocephalus, or mass lesion/mass effect. Cerebral and cerebellar atrophy. There is a remote infarct in the right MCA territory with volume loss.  These results were called by telephone at the time of interpretation on 12/16/2012 at 6:29 AMto Dr Thad Ranger, who verbally acknowledged these results. .  IMPRESSION: 1. No evidence of acute intracranial disease. 2. Remote right MCA territory infarct. 3. Newly opacified left maxillary sinus.   Electronically Signed   By: Tiburcio Pea   On: 12/16/2012 06:32    MEDICATIONS                                                                                                                        I have reviewed the patient's current medications.  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 Continue keppra 500 mg BID. Back to baseline and from a neuro standpoint he can be discharge today and arrange neurology follow up as outpatient in 2-3 weeks.  Wyatt Portela, MD [facility] [phone]  12/17/2012, 10:10 AM

## 2012-12-17 NOTE — Progress Notes (Signed)
Attempted to call patients 2 sisters and his brother 3 times to ask them to pick up patient.  I have left 2 messages on each of their voicemail boxes and I have had the patient leave messages to please pick him up.  Will continue to monitor and will wait to hear from family.  SW and Case mgmt aware as is Chiropodist, Allena Day.  Lance Bosch, RN

## 2012-12-17 NOTE — Progress Notes (Signed)
Patient has been discharged and we are trying to call family to pick him up.  Will continue to monitor.  Lance Bosch, RN

## 2013-03-31 ENCOUNTER — Encounter (HOSPITAL_COMMUNITY): Payer: Self-pay | Admitting: Emergency Medicine

## 2013-03-31 ENCOUNTER — Emergency Department (HOSPITAL_COMMUNITY)
Admission: EM | Admit: 2013-03-31 | Discharge: 2013-03-31 | Disposition: A | Discharge status: Home / Self Care | Payer: Medicare HMO | Attending: Emergency Medicine | Admitting: Emergency Medicine

## 2013-03-31 DIAGNOSIS — F17200 Nicotine dependence, unspecified, uncomplicated: Secondary | ICD-10-CM | POA: Insufficient documentation

## 2013-03-31 DIAGNOSIS — M25569 Pain in unspecified knee: Secondary | ICD-10-CM | POA: Insufficient documentation

## 2013-03-31 DIAGNOSIS — I10 Essential (primary) hypertension: Secondary | ICD-10-CM | POA: Insufficient documentation

## 2013-03-31 DIAGNOSIS — F1021 Alcohol dependence, in remission: Secondary | ICD-10-CM | POA: Insufficient documentation

## 2013-03-31 DIAGNOSIS — Z8673 Personal history of transient ischemic attack (TIA), and cerebral infarction without residual deficits: Secondary | ICD-10-CM | POA: Insufficient documentation

## 2013-03-31 DIAGNOSIS — J4489 Other specified chronic obstructive pulmonary disease: Secondary | ICD-10-CM | POA: Insufficient documentation

## 2013-03-31 DIAGNOSIS — Z79899 Other long term (current) drug therapy: Secondary | ICD-10-CM | POA: Insufficient documentation

## 2013-03-31 DIAGNOSIS — J449 Chronic obstructive pulmonary disease, unspecified: Secondary | ICD-10-CM | POA: Insufficient documentation

## 2013-03-31 DIAGNOSIS — IMO0002 Reserved for concepts with insufficient information to code with codable children: Secondary | ICD-10-CM | POA: Insufficient documentation

## 2013-03-31 DIAGNOSIS — R52 Pain, unspecified: Secondary | ICD-10-CM | POA: Insufficient documentation

## 2013-03-31 DIAGNOSIS — M25561 Pain in right knee: Secondary | ICD-10-CM

## 2013-03-31 DIAGNOSIS — G40909 Epilepsy, unspecified, not intractable, without status epilepticus: Secondary | ICD-10-CM | POA: Insufficient documentation

## 2013-03-31 MED ORDER — PREDNISONE 50 MG PO TABS
60.0000 mg | ORAL_TABLET | Freq: Once | ORAL | Status: AC
  Administered 2013-03-31: 15:00:00 60 mg via ORAL
  Filled 2013-03-31 (×2): qty 1

## 2013-03-31 MED ORDER — PREDNISONE 20 MG PO TABS: 20.0000 mg | ORAL_TABLET | Freq: Two times a day (BID) | ORAL | Status: DC

## 2013-03-31 NOTE — ED Provider Notes (Signed)
CSN: [id number]     Arrival date & time 03/31/13  1100 History   First MD Initiated Contact with Patient 03/31/13 1427     Chief Complaint  Patient presents with  . Knee Pain   (Consider location/radiation/quality/duration/timing/severity/associated sxs/prior Treatment) HPI Comments: SRIANSH FARRA is a 67 y.o. male presents for evaluation of right knee pain. He has been hurting for several weeks. He feels like it is from his gout. He is using his usual medication, which includes ibuprofen, without relief. He denies fever, chills, nausea, vomiting, weakness, or dizziness. The pain in his right knee is worse with ambulation. There are no other known modifying factors.   The history is provided by the patient.   HPI Comments: YEHONATAN GRANDISON is a 67 y.o. male who presents to the Emergency Department complaining of    Past Medical History  Diagnosis Date  . COPD (chronic obstructive pulmonary disease)   . Stroke   . Hypertension   . Gout   . Alcohol abuse   . Seizures    History reviewed. No pertinent past surgical history. History reviewed. No pertinent family history. History  Substance Use Topics  . Smoking status: Current Every Day Smoker -- 1.00 packs/day    Types: Cigarettes  . Smokeless tobacco: Not on file  . Alcohol Use: Yes     Comment: liquor daily    Review of Systems  All other systems reviewed and are negative.    Allergies  Review of patient's allergies indicates no known allergies.  Home Medications   Current Outpatient Rx  Name  Route  Sig  Dispense  Refill  . amLODipine (NORVASC) 2.5 MG tablet   Oral   Take 1 tablet (2.5 mg total) by mouth daily.   30 tablet   0   . ibuprofen (ADVIL,MOTRIN) 200 MG tablet   Oral   Take 400 mg by mouth every 6 (six) hours as needed for pain. For gout symptoms         . levETIRAcetam (KEPPRA) 500 MG tablet   Oral   Take 1 tablet (500 mg total) by mouth 2 (two) times daily.   60 tablet   0   .  Multiple Vitamin (MULTIVITAMIN WITH MINERALS) TABS tablet   Oral   Take 1 tablet by mouth daily.         . predniSONE (DELTASONE) 20 MG tablet   Oral   Take 1 tablet (20 mg total) by mouth 2 (two) times daily.   10 tablet   0    BP 121/87  Pulse 91  Temp(Src) 98.2 F (36.8 C) (Oral)  Resp 20  Ht 5\' 10"  (1.778 m)  Wt 136 lb (61.689 kg)  BMI 19.51 kg/m2  SpO2 100% Physical Exam  Nursing note and vitals reviewed. Constitutional: He is oriented to person, place, and time. He appears well-developed.  Appears older than stated age  HENT:  Head: Normocephalic and atraumatic.  Right Ear: External ear normal.  Left Ear: External ear normal.  Eyes: Conjunctivae and EOM are normal. Pupils are equal, round, and reactive to light.  Neck: Normal range of motion and phonation normal. Neck supple.  Cardiovascular: Normal rate.   Pulmonary/Chest: Effort normal. He exhibits no bony tenderness.  Abdominal: Normal appearance.  Musculoskeletal:  Pills the right knee, flexed at 90. He resists any extension of the right knee. There is a moderate sized right knee effusion. The knee is mostly tender to the lateral aspect, the tenderness is moderate.  There is no associated erythema of the right knee.  Neurological: He is alert and oriented to person, place, and time. No cranial nerve deficit or sensory deficit. He exhibits normal muscle tone. Coordination normal.  Skin: Skin is warm, dry and intact.  Psychiatric: He has a normal mood and affect. His behavior is normal. Judgment and thought content normal.    ED Course  Procedures (including critical care time) Medications  predniSONE (DELTASONE) tablet 60 mg (60 mg Oral Given 03/31/13 1454)     Labs Review Labs Reviewed - No data to display Imaging Review No results found.  EKG Interpretation   None       MDM   1. Knee pain, acute, right    Knee pain, consistent with recurrence of gout, arthritis. Doubt septic joint, metabolic  instability, or serious bacterial infection.  The patient appears reasonably screened and/or stabilized for discharge and I doubt any other medical condition or other [facility] requiring further screening, evaluation, or treatment in the ED at this time prior to discharge.  Nursing Notes Reviewed/ Care Coordinated, and agree without changes. Applicable Imaging Reviewed.  Interpretation of Laboratory Data incorporated into ED treatment   Plan: Home Medications- prednisone; Home Treatments and Observation- rest; return here if the recommended treatment, does not improve the symptoms; Recommended follow up- PCP, when necessary      Flint Melter, MD 03/31/13 1625

## 2013-03-31 NOTE — ED Notes (Signed)
Pain rt knee pain, thinks is due to gout  No injury

## 2013-04-09 ENCOUNTER — Emergency Department (HOSPITAL_COMMUNITY): Payer: Medicare HMO

## 2013-04-09 ENCOUNTER — Encounter (HOSPITAL_COMMUNITY): Payer: Self-pay | Admitting: Emergency Medicine

## 2013-04-09 ENCOUNTER — Inpatient Hospital Stay (HOSPITAL_COMMUNITY)
Admission: EM | Admit: 2013-04-09 | Discharge: 2013-04-14 | DRG: 897 | Disposition: A | Discharge status: Skilled Nursing Facility | Payer: Medicare HMO | Attending: Internal Medicine | Admitting: Internal Medicine

## 2013-04-09 DIAGNOSIS — H53469 Homonymous bilateral field defects, unspecified side: Secondary | ICD-10-CM | POA: Diagnosis present

## 2013-04-09 DIAGNOSIS — F10239 Alcohol dependence with withdrawal, unspecified: Secondary | ICD-10-CM | POA: Diagnosis present

## 2013-04-09 DIAGNOSIS — M109 Gout, unspecified: Secondary | ICD-10-CM | POA: Diagnosis present

## 2013-04-09 DIAGNOSIS — G40909 Epilepsy, unspecified, not intractable, without status epilepticus: Secondary | ICD-10-CM | POA: Diagnosis present

## 2013-04-09 DIAGNOSIS — H539 Unspecified visual disturbance: Secondary | ICD-10-CM

## 2013-04-09 DIAGNOSIS — Z8673 Personal history of transient ischemic attack (TIA), and cerebral infarction without residual deficits: Secondary | ICD-10-CM

## 2013-04-09 DIAGNOSIS — R569 Unspecified convulsions: Secondary | ICD-10-CM | POA: Diagnosis present

## 2013-04-09 DIAGNOSIS — J4489 Other specified chronic obstructive pulmonary disease: Secondary | ICD-10-CM | POA: Diagnosis present

## 2013-04-09 DIAGNOSIS — I69998 Other sequelae following unspecified cerebrovascular disease: Secondary | ICD-10-CM

## 2013-04-09 DIAGNOSIS — F17200 Nicotine dependence, unspecified, uncomplicated: Secondary | ICD-10-CM | POA: Diagnosis present

## 2013-04-09 DIAGNOSIS — J449 Chronic obstructive pulmonary disease, unspecified: Secondary | ICD-10-CM | POA: Diagnosis present

## 2013-04-09 DIAGNOSIS — Z91199 Patient's noncompliance with other medical treatment and regimen due to unspecified reason: Secondary | ICD-10-CM

## 2013-04-09 DIAGNOSIS — Z9119 Patient's noncompliance with other medical treatment and regimen: Secondary | ICD-10-CM

## 2013-04-09 DIAGNOSIS — J441 Chronic obstructive pulmonary disease with (acute) exacerbation: Secondary | ICD-10-CM

## 2013-04-09 DIAGNOSIS — I10 Essential (primary) hypertension: Secondary | ICD-10-CM | POA: Diagnosis present

## 2013-04-09 DIAGNOSIS — G8190 Hemiplegia, unspecified affecting unspecified side: Secondary | ICD-10-CM | POA: Diagnosis present

## 2013-04-09 DIAGNOSIS — F10939 Alcohol use, unspecified with withdrawal, unspecified: Principal | ICD-10-CM | POA: Diagnosis present

## 2013-04-09 DIAGNOSIS — F1020 Alcohol dependence, uncomplicated: Secondary | ICD-10-CM | POA: Diagnosis present

## 2013-04-09 DIAGNOSIS — Z23 Encounter for immunization: Secondary | ICD-10-CM

## 2013-04-09 LAB — URINE MICROSCOPIC-ADD ON

## 2013-04-09 LAB — RAPID URINE DRUG SCREEN, HOSP PERFORMED
AMPHETAMINES: NOT DETECTED
BARBITURATES: NOT DETECTED
BENZODIAZEPINES: NOT DETECTED
Cocaine: NOT DETECTED
Opiates: NOT DETECTED
TETRAHYDROCANNABINOL: NOT DETECTED

## 2013-04-09 LAB — COMPREHENSIVE METABOLIC PANEL
ALBUMIN: 2.9 g/dL — AB (ref 3.5–5.2)
ALK PHOS: 111 U/L (ref 39–117)
ALT: 64 U/L — ABNORMAL HIGH (ref 0–53)
AST: 193 U/L — AB (ref 0–37)
BILIRUBIN TOTAL: 0.3 mg/dL (ref 0.3–1.2)
BUN: 21 mg/dL (ref 6–23)
CHLORIDE: 101 meq/L (ref 96–112)
CO2: 17 meq/L — AB (ref 19–32)
CREATININE: 1.08 mg/dL (ref 0.50–1.35)
Calcium: 8.3 mg/dL — ABNORMAL LOW (ref 8.4–10.5)
GFR calc Af Amer: 81 mL/min — ABNORMAL LOW (ref >90)
GFR, EST NON AFRICAN AMERICAN: 70 mL/min — AB (ref >90)
Glucose, Bld: 224 mg/dL — ABNORMAL HIGH (ref 70–99)
POTASSIUM: 3.7 meq/L (ref 3.7–5.3)
Sodium: 145 mEq/L (ref 137–147)
Total Protein: 7.5 g/dL (ref 6.0–8.3)

## 2013-04-09 LAB — URINALYSIS, ROUTINE W REFLEX MICROSCOPIC
BILIRUBIN URINE: NEGATIVE
GLUCOSE, UA: NEGATIVE mg/dL
KETONES UR: NEGATIVE mg/dL
Leukocytes, UA: NEGATIVE
Nitrite: NEGATIVE
PH: 6 (ref 5.0–8.0)
PROTEIN: 30 mg/dL — AB
Specific Gravity, Urine: >1.03 — ABNORMAL HIGH (ref 1.005–1.030)
Urobilinogen, UA: 0.2 mg/dL (ref 0.0–1.0)

## 2013-04-09 LAB — CBC WITH DIFFERENTIAL/PLATELET
BASOS ABS: 0 10*3/uL (ref 0.0–0.1)
Basophils Relative: 0 % (ref 0–1)
EOS PCT: 0 % (ref 0–5)
Eosinophils Absolute: 0 10*3/uL (ref 0.0–0.7)
HEMATOCRIT: 41.8 % (ref 39.0–52.0)
Hemoglobin: 13.4 g/dL (ref 13.0–17.0)
LYMPHS ABS: 0.5 10*3/uL — AB (ref 0.7–4.0)
LYMPHS PCT: 13 % (ref 12–46)
MCH: 30.4 pg (ref 26.0–34.0)
MCHC: 32.1 g/dL (ref 30.0–36.0)
MCV: 94.8 fL (ref 78.0–100.0)
MONO ABS: 0.1 10*3/uL (ref 0.1–1.0)
Monocytes Relative: 3 % (ref 3–12)
NEUTROS ABS: 3.3 10*3/uL (ref 1.7–7.7)
Neutrophils Relative %: 84 % — ABNORMAL HIGH (ref 43–77)
Platelets: 284 10*3/uL (ref 150–400)
RBC: 4.41 MIL/uL (ref 4.22–5.81)
RDW: 14.6 % (ref 11.5–15.5)
WBC: 3.9 10*3/uL — AB (ref 4.0–10.5)

## 2013-04-09 LAB — TROPONIN I: Troponin I: <0.3 ng/mL (ref <0.30)

## 2013-04-09 LAB — PRO B NATRIURETIC PEPTIDE: PRO B NATRI PEPTIDE: 753.9 pg/mL — AB (ref 0–125)

## 2013-04-09 LAB — CK: CK TOTAL: 52 U/L (ref 7–232)

## 2013-04-09 LAB — ETHANOL: ALCOHOL ETHYL (B): 19 mg/dL — AB (ref 0–11)

## 2013-04-09 MED ORDER — INFLUENZA VAC SPLIT QUAD 0.5 ML IM SUSP
0.5000 mL | INTRAMUSCULAR | Status: AC
  Administered 2013-04-10: 0.5 mL via INTRAMUSCULAR
  Filled 2013-04-09: qty 0.5

## 2013-04-09 MED ORDER — LORAZEPAM 2 MG/ML IJ SOLN
1.0000 mg | Freq: Once | INTRAMUSCULAR | Status: AC
  Administered 2013-04-09: 1 mg via INTRAVENOUS

## 2013-04-09 MED ORDER — SODIUM CHLORIDE 0.9 % IV SOLN
1000.0000 mg | Freq: Once | INTRAVENOUS | Status: DC
  Filled 2013-04-09: qty 10

## 2013-04-09 MED ORDER — HEPARIN SODIUM (PORCINE) 5000 UNIT/ML IJ SOLN
5000.0000 [IU] | Freq: Three times a day (TID) | INTRAMUSCULAR | Status: DC
  Administered 2013-04-09 – 2013-04-14 (×13): 5000 [IU] via SUBCUTANEOUS
  Filled 2013-04-09 (×13): qty 1

## 2013-04-09 MED ORDER — LEVETIRACETAM 500 MG/5ML IV SOLN
INTRAVENOUS | Status: AC
  Filled 2013-04-09: qty 10

## 2013-04-09 MED ORDER — DEXTROSE-NACL 5-0.9 % IV SOLN
INTRAVENOUS | Status: DC
  Administered 2013-04-09 – 2013-04-13 (×8): via INTRAVENOUS

## 2013-04-09 MED ORDER — LORAZEPAM 2 MG/ML IJ SOLN
1.0000 mg | INTRAMUSCULAR | Status: DC | PRN
  Administered 2013-04-09 – 2013-04-14 (×5): 1 mg via INTRAVENOUS
  Filled 2013-04-09 (×6): qty 1

## 2013-04-09 MED ORDER — SODIUM CHLORIDE 0.9 % IV SOLN: 1000.0000 mg | Freq: Once | INTRAVENOUS | Status: DC

## 2013-04-09 MED ORDER — THIAMINE HCL 100 MG/ML IJ SOLN
100.0000 mg | Freq: Every day | INTRAMUSCULAR | Status: DC
  Administered 2013-04-09 – 2013-04-14 (×6): 100 mg via INTRAVENOUS
  Filled 2013-04-09 (×6): qty 2

## 2013-04-09 MED ORDER — SODIUM CHLORIDE 0.9 % IV BOLUS (SEPSIS): 500.0000 mL | Freq: Once | INTRAVENOUS | Status: AC

## 2013-04-09 MED ORDER — ASPIRIN 325 MG PO TABS
325.0000 mg | ORAL_TABLET | Freq: Every day | ORAL | Status: DC
  Administered 2013-04-09 – 2013-04-14 (×6): 325 mg via ORAL
  Filled 2013-04-09 (×6): qty 1

## 2013-04-09 MED ORDER — LEVETIRACETAM 500 MG PO TABS
500.0000 mg | ORAL_TABLET | Freq: Two times a day (BID) | ORAL | Status: DC
  Administered 2013-04-09: 500 mg via ORAL
  Filled 2013-04-09: qty 1

## 2013-04-09 MED ORDER — SODIUM CHLORIDE 0.9 % IV SOLN
INTRAVENOUS | Status: AC
  Administered 2013-04-09: 06:00:00 via INTRAVENOUS

## 2013-04-09 MED ORDER — LEVETIRACETAM 500 MG PO TABS
1000.0000 mg | ORAL_TABLET | Freq: Two times a day (BID) | ORAL | Status: DC
  Administered 2013-04-09 – 2013-04-14 (×10): 1000 mg via ORAL
  Filled 2013-04-09 (×10): qty 2

## 2013-04-09 MED ORDER — PREDNISONE 20 MG PO TABS
20.0000 mg | ORAL_TABLET | Freq: Two times a day (BID) | ORAL | Status: DC
  Administered 2013-04-09 – 2013-04-14 (×11): 20 mg via ORAL
  Filled 2013-04-09 (×11): qty 1

## 2013-04-09 MED ORDER — SODIUM CHLORIDE 0.9 % IV SOLN
1000.0000 mg | Freq: Once | INTRAVENOUS | Status: AC
  Administered 2013-04-09: 1000 mg via INTRAVENOUS
  Filled 2013-04-09: qty 10

## 2013-04-09 MED ORDER — LORAZEPAM 2 MG/ML IJ SOLN
INTRAMUSCULAR | Status: AC
  Filled 2013-04-09: qty 1

## 2013-04-09 MED ORDER — AMLODIPINE BESYLATE 5 MG PO TABS
2.5000 mg | ORAL_TABLET | Freq: Every day | ORAL | Status: DC
  Administered 2013-04-09 – 2013-04-14 (×6): 2.5 mg via ORAL
  Filled 2013-04-09 (×6): qty 1

## 2013-04-09 MED ORDER — LABETALOL HCL 5 MG/ML IV SOLN
10.0000 mg | INTRAVENOUS | Status: DC | PRN
  Administered 2013-04-09 – 2013-04-10 (×4): 10 mg via INTRAVENOUS
  Filled 2013-04-09 (×4): qty 4

## 2013-04-09 MED ORDER — LABETALOL HCL 5 MG/ML IV SOLN
20.0000 mg | Freq: Once | INTRAVENOUS | Status: AC
  Administered 2013-04-09: 20 mg via INTRAVENOUS
  Filled 2013-04-09: qty 4

## 2013-04-09 NOTE — Progress Notes (Signed)
UR chart review completed.  

## 2013-04-09 NOTE — H&P (Signed)
Jesus Lin MRN: [id number] DOB/AGE: 67/05/1945 67 y.o. Primary Care Physician:Klaire Court, MD Admit date: 04/09/2013 Chief Complaint:  seizures HPI:  This is a 68 years old male patient with history of multiple medical illnesses including alcohol abuse and seizure disorder was brought to Er due to seizure activities. He more like shaking of the left side. Patient continued to drink alcohol daily. IN Er patient is loaded with Keppra and was given IV ativan. He currently sedated. No specific complaint, Unable to give history of review of systems.  Past Medical History  Diagnosis Date  . COPD (chronic obstructive pulmonary disease)   . Stroke   . Hypertension   . Gout   . Alcohol abuse   . Seizures    History reviewed. No pertinent past surgical history.      History reviewed. No pertinent family history.  Social History:  reports that he has been smoking Cigarettes.  He has been smoking about 1.00 pack per day. He does not have any smokeless tobacco history on file. He reports that he drinks alcohol. He reports that he does not use illicit drugs.   Allergies: No Known Allergies  Medications Prior to Admission  Medication Sig Dispense Refill  . amLODipine (NORVASC) 2.5 MG tablet Take 1 tablet (2.5 mg total) by mouth daily.  30 tablet  0  . ibuprofen (ADVIL,MOTRIN) 200 MG tablet Take 400 mg by mouth every 6 (six) hours as needed for pain. For gout symptoms      . levETIRAcetam (KEPPRA) 500 MG tablet Take 1 tablet (500 mg total) by mouth 2 (two) times daily.  60 tablet  0  . Multiple Vitamin (MULTIVITAMIN WITH MINERALS) TABS tablet Take 1 tablet by mouth daily.      . predniSONE (DELTASONE) 20 MG tablet Take 1 tablet (20 mg total) by mouth 2 (two) times daily.  10 tablet  0       WUJ:WJXBJROS:apart from the symptoms mentioned above,there are no other symptoms referable to all systems reviewed.  Physical Exam: Blood pressure 181/117, pulse 95, temperature 98.3 F (36.8 C),  temperature source Oral, resp. rate 12, SpO2 96.00%. General Condition- Patient is sedated but arausable HE ENT- pupils equal and reactive, neck supple, Respiratory- decreased air entry,  Bilateral rhonchi CVS- S1 and S2 heard, no murmur or gallop  ABD- soft and lax, bowel sound ++ EXT- no leg edema    Recent Labs  04/09/13 0053  WBC 3.9*  NEUTROABS 3.3  HGB 13.4  HCT 41.8  MCV 94.8  PLT 284    Recent Labs  04/09/13 0053  NA 145  K 3.7  CL 101  CO2 17*  GLUCOSE 224*  BUN 21  CREATININE 1.08  CALCIUM 8.3*  lablast2(ast:2,ALT:2,alkphos:2,bilitot:2,prot:2,albumin:2)@    No results found for this or any previous visit (from the past 240 hour(s)).   Ct Head Wo Contrast  04/09/2013   CLINICAL DATA:  Tremor, possible seizure. History of stroke, hypertension and seizures.  EXAM: CT HEAD WITHOUT CONTRAST  TECHNIQUE: Contiguous axial images were obtained from the base of the skull through the vertex without intravenous contrast.  COMPARISON:  CT of the head December 16, 2012  FINDINGS: Right frontotemporal parietal encephalomalacia again noted with ex vacuo dilatation of the right lateral ventricle, right temporal horn. No hydrocephalus. Patchy supratentorial white matter hypodensities exclusive of the encephalomalacia may reflect chronic small vessel ischemic disease. No acute intra cranial hemorrhage, mass effect, midline shift nor large vascular territory infarct.  No abnormal extra-axial fluid  collections. Moderate calcific atherosclerosis of the carotid siphons. Poor dentition addition. Chronic paranasal sinusitis with near complete opacification left maxillary sinus. Periosteal Re absorption of the left maxilla partially imaged. Ocular globes and orbital contents are unremarkable though not tailored for evaluation. Left facial cutaneous nodule measures 10 mm, and though could reflect a sebaceous cyst, recommend direct inspection.  IMPRESSION: No acute intracranial process.  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 Moderate white matter changes suggest chronic small vessel ischemic disease.  Chronic paranasal sinusitis, with periosteal reabsorption of the left mandible, incompletely characterized.   Electronically Signed   By: Awilda Metro   On: 04/09/2013 04:16   Dg Chest Port 1 View  04/09/2013   CLINICAL DATA:  Shortness of breath and seizure.  COPD.  EXAM: PORTABLE CHEST - 1 VIEW  COMPARISON:  Chest radiograph Sep 01, 2011  FINDINGS: Cardiac silhouette is unremarkable and unchanged. Tortuous and possibly ectatic aorta is similar. Mild fullness of the pulmonary hilar are unchanged, and may reflect pulmonary arterial hypertension. Mild chronic interstitial changes within increased lung volumes. Minimal strandy densities in the lung bases. No pleural effusions or focal consolidations. No pneumothorax.  Tiny calcific densities project over right scapula and could reflect phleboliths, less likely loose bodies. Stable sclerotic nonspecific lesion in left humeral metadiaphysis. Multiple EKG lines overlie the patient and may obscure subtle underlying pathology.  IMPRESSION: Stable cardiomegaly and COPD with bibasilar atelectasis.   Electronically Signed   By: Awilda Metro   On: 04/09/2013 01:16   Impression:  Active Problems:   Alcohol withdrawal seizure Seizure disorder Hypertension COPD    Plan: Medications reviewed Will continue his regular medication IV hydration Neurology consult Continue CIWA protocol      [facility] Pager [phone]  04/09/2013, 7:59 AM

## 2013-04-09 NOTE — Progress Notes (Signed)
Patient not tolerating bipap, patient taken off before CT trip, MD notified. RT will continue to monitor, bipap on standby at bedside

## 2013-04-09 NOTE — ED Notes (Signed)
Pt to department via EMS.  Per report, last drink was about 9 am.  Pt reports daily ETOH use.

## 2013-04-09 NOTE — Consult Note (Signed)
[employer] A. Merlene Laughter, MD     [email or web address]          Jesus Lin is an 68 y.o. male.   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 The patient's Keppra will be increased so thousand milligrams twice a day. An EEG will be obtained. Patient is to continue with aspirin therapy for secondary stroke prevention. DVT prophylaxis is initiated.   Alcoholism. Cessation is discussed with the patient. Thiamine added.   The patient is a 68 year old black male who has a known history of epilepsy likely related to his old infarct he had previously. The patient's old infarct occurred a couple years ago. He was worked up here and I believe also in [place]. The workup included carotid duplex Doppler which looks okay although there was question of acute plaque that may have ruptured on the ultrasound. CTA was also fine. MRI revealed acute infarct at that time. Echo was not done although it is unlikely that results would change therapy given his history. The patient was not patient anticoagulation because of seizure history, history of falls and noncompliance. The patient continues to consume large doses of alcohol. He reports being compliant with his seizure medications taken it twice a day. The patient presented to the hospital because of having recurrent shaking activity of the left side especially left upper extremity. History is limited and insight is poor, however the review of systems otherwise negative.   GENERAL: Pleasant man in no acute distress. He is rather unkempt.  HEENT: On entering the exam room the patient's head is turned to the right and there since be deviation of his eyes to the right suspicious of focal seizures. He seemed a little confused and a little less responsive at that time.  ABDOMEN: soft  EXTREMITIES: No edema    BACK: Unremarkable.  SKIN: Normal by inspection.    MENTAL STATUS: The patient is awake although he requires some prompting to do the evaluation. He notes that he is in the hospital at [facility]. He follows commands fairly well. Speech is slightly dysarthric. There is a clear left-sided neglect.  CRANIAL NERVES: Pupils are equal, round and reactive to light and accommodation; extra ocular movements are full, there is no significant nystagmus; visual fields shows a dense left homonymous hemianopia; upper and lower facial muscles are normal in strength and symmetric, there is no flattening of the nasolabial folds; tongue is midline; uvula is midline.  MOTOR: Left upper extremity 3/5. Left leg 4/5. Right side shows normal strength 5.  COORDINATION: Left finger to nose is normal, right finger to nose is normal, No rest tremor; no intention tremor; no postural tremor; no bradykinesia.  REFLEXES: Deep tendon reflexes are symmetrical and normal. Plantar responses are flexor bilaterally.   SENSATION: Normal to light touch and temperature.    Past Medical History  Diagnosis Date  . COPD (chronic obstructive pulmonary disease)   . Stroke   . Hypertension   . Gout   . Alcohol abuse   . Seizures     History reviewed. No pertinent past surgical history.  History reviewed. No pertinent family history.  Social History:  reports that he has been smoking Cigarettes.  He has been smoking about 1.00 pack per day. He does not have any smokeless tobacco history on file. He reports that he drinks alcohol. He reports that he does not use  illicit drugs.  Allergies: No Known Allergies  Medications: Prior to Admission medications   Medication Sig Start Date End Date Taking? Authorizing Provider  levETIRAcetam (KEPPRA) 500 MG tablet Take 1 tablet (500 mg total) by mouth 2 (two) times daily. 12/16/12  Yes Elyn Peers, MD  predniSONE (DELTASONE) 20 MG tablet Take 1 tablet (20 mg total) by mouth 2  (two) times daily. 03/31/13  Yes Richarda Blade, MD    Scheduled Meds: . sodium chloride   Intravenous STAT  . amLODipine  2.5 mg Oral Daily  . [START ON 04/10/2013] influenza vac split quadrivalent PF  0.5 mL Intramuscular Tomorrow-1000  . levETIRAcetam  500 mg Oral BID  . predniSONE  20 mg Oral BID   Continuous Infusions: . dextrose 5 % and 0.9% NaCl 100 mL/hr at 04/09/13 0851   PRN Meds:.labetalol, LORazepam    Blood pressure 152/95, pulse 73, temperature 97.8 F (36.6 C), temperature source Oral, resp. rate 14, height 5' 10"  (1.778 m), weight 61.689 kg (136 lb), SpO2 100.00%.   Results for orders placed during the hospital encounter of 04/09/13 (from the past 48 hour(s))  CBC WITH DIFFERENTIAL     Status: Abnormal   Collection Time    04/09/13 12:53 AM      Result Value Range   WBC 3.9 (*) 4.0 - 10.5 K/uL   RBC 4.41  4.22 - 5.81 MIL/uL   Hemoglobin 13.4  13.0 - 17.0 g/dL   HCT 41.8  39.0 - 52.0 %   MCV 94.8  78.0 - 100.0 fL   MCH 30.4  26.0 - 34.0 pg   MCHC 32.1  30.0 - 36.0 g/dL   RDW 14.6  11.5 - 15.5 %   Platelets 284  150 - 400 K/uL   Neutrophils Relative % 84 (*) 43 - 77 %   Neutro Abs 3.3  1.7 - 7.7 K/uL   Lymphocytes Relative 13  12 - 46 %   Lymphs Abs 0.5 (*) 0.7 - 4.0 K/uL   Monocytes Relative 3  3 - 12 %   Monocytes Absolute 0.1  0.1 - 1.0 K/uL   Eosinophils Relative 0  0 - 5 %   Eosinophils Absolute 0.0  0.0 - 0.7 K/uL   Basophils Relative 0  0 - 1 %   Basophils Absolute 0.0  0.0 - 0.1 K/uL  PRO B NATRIURETIC PEPTIDE     Status: Abnormal   Collection Time    04/09/13 12:53 AM      Result Value Range   Pro B Natriuretic peptide (BNP) 753.9 (*) 0 - 125 pg/mL  COMPREHENSIVE METABOLIC PANEL     Status: Abnormal   Collection Time    04/09/13 12:53 AM      Result Value Range   Sodium 145  137 - 147 mEq/L   Potassium 3.7  3.7 - 5.3 mEq/L   Chloride 101  96 - 112 mEq/L   CO2 17 (*) 19 - 32 mEq/L   Glucose, Bld 224 (*) 70 - 99 mg/dL   BUN 21  6 - 23  mg/dL   Creatinine, Ser 1.08  0.50 - 1.35 mg/dL   Calcium 8.3 (*) 8.4 - 10.5 mg/dL   Total Protein 7.5  6.0 - 8.3 g/dL   Albumin 2.9 (*) 3.5 - 5.2 g/dL   AST 193 (*) 0 - 37 U/L   ALT 64 (*) 0 - 53 U/L   Alkaline Phosphatase 111  39 - 117 U/L   Total Bilirubin  0.3  0.3 - 1.2 mg/dL   GFR calc non Af Amer 70 (*) >90 mL/min   GFR calc Af Amer 81 (*) >90 mL/min   Comment: (NOTE)     The eGFR has been calculated using the CKD EPI equation.     This calculation has not been validated in all clinical situations.     eGFR's persistently <90 mL/min signify possible Chronic Kidney     Disease.  TROPONIN I     Status: None   Collection Time    04/09/13 12:53 AM      Result Value Range   Troponin I <0.30  <0.30 ng/mL   Comment:            Due to the release kinetics of cTnI,     a negative result within the first hours     of the onset of symptoms does not rule out     myocardial infarction with certainty.     If myocardial infarction is still suspected,     repeat the test at appropriate intervals.  ETHANOL     Status: Abnormal   Collection Time    04/09/13 12:53 AM      Result Value Range   Alcohol, Ethyl (B) 19 (*) 0 - 11 mg/dL   Comment:            LOWEST DETECTABLE LIMIT FOR     SERUM ALCOHOL IS 11 mg/dL     FOR MEDICAL PURPOSES ONLY  CK     Status: None   Collection Time    04/09/13 12:53 AM      Result Value Range   Total CK 52  7 - 232 U/L  URINALYSIS, ROUTINE W REFLEX MICROSCOPIC     Status: Abnormal   Collection Time    04/09/13  1:32 AM      Result Value Range   Color, Urine YELLOW  YELLOW   APPearance CLEAR  CLEAR   Specific Gravity, Urine >1.030 (*) 1.005 - 1.030   pH 6.0  5.0 - 8.0   Glucose, UA NEGATIVE  NEGATIVE mg/dL   Hgb urine dipstick TRACE (*) NEGATIVE   Bilirubin Urine NEGATIVE  NEGATIVE   Ketones, ur NEGATIVE  NEGATIVE mg/dL   Protein, ur 30 (*) NEGATIVE mg/dL   Urobilinogen, UA 0.2  0.0 - 1.0 mg/dL   Nitrite NEGATIVE  NEGATIVE   Leukocytes, UA  NEGATIVE  NEGATIVE  URINE RAPID DRUG SCREEN (HOSP PERFORMED)     Status: None   Collection Time    04/09/13  1:32 AM      Result Value Range   Opiates NONE DETECTED  NONE DETECTED   Cocaine NONE DETECTED  NONE DETECTED   Benzodiazepines NONE DETECTED  NONE DETECTED   Amphetamines NONE DETECTED  NONE DETECTED   Tetrahydrocannabinol NONE DETECTED  NONE DETECTED   Barbiturates NONE DETECTED  NONE DETECTED   Comment:            DRUG SCREEN FOR MEDICAL PURPOSES     ONLY.  IF CONFIRMATION IS NEEDED     FOR ANY PURPOSE, NOTIFY LAB     WITHIN 5 DAYS.                LOWEST DETECTABLE LIMITS     FOR URINE DRUG SCREEN     Drug Class       Cutoff (ng/mL)     Amphetamine      1000     Barbiturate  200     Benzodiazepine   417     Tricyclics       408     Opiates          300     Cocaine          300     THC              50  URINE MICROSCOPIC-ADD ON     Status: Abnormal   Collection Time    04/09/13  1:32 AM      Result Value Range   Squamous Epithelial / LPF RARE  RARE   WBC, UA 0-2  <3 WBC/hpf   RBC / HPF 0-2  <3 RBC/hpf   Bacteria, UA MANY (*) RARE   Casts HYALINE CASTS (*) NEGATIVE   Comment: GRANULAR CAST   Urine-Other MUCOUS PRESENT      Ct Head Wo Contrast  04/09/2013   CLINICAL DATA:  Tremor, possible seizure. History of stroke, hypertension and seizures.  EXAM: CT HEAD WITHOUT CONTRAST  TECHNIQUE: Contiguous axial images were obtained from the base of the skull through the vertex without intravenous contrast.  COMPARISON:  CT of the head December 16, 2012  FINDINGS: Right frontotemporal parietal encephalomalacia again noted with ex vacuo dilatation of the right lateral ventricle, right temporal horn. No hydrocephalus. Patchy supratentorial white matter hypodensities exclusive of the encephalomalacia may reflect chronic small vessel ischemic disease. No acute intra cranial hemorrhage, mass effect, midline shift nor large vascular territory infarct.  No abnormal extra-axial  fluid collections. Moderate calcific atherosclerosis of the carotid siphons. Poor dentition addition. Chronic paranasal sinusitis with near complete opacification left maxillary sinus. Periosteal Re absorption of the left maxilla partially imaged. Ocular globes and orbital contents are unremarkable though not tailored for evaluation. Left facial cutaneous nodule measures 10 mm, and though could reflect a sebaceous cyst, recommend direct inspection.  IMPRESSION: No acute intracranial process.  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 Moderate white matter changes suggest chronic small vessel ischemic disease.  Chronic paranasal sinusitis, with periosteal reabsorption of the left mandible, incompletely characterized.   Electronically Signed   By: Elon Alas   On: 04/09/2013 04:16   Dg Chest Port 1 View  04/09/2013   CLINICAL DATA:  Shortness of breath and seizure.  COPD.  EXAM: PORTABLE CHEST - 1 VIEW  COMPARISON:  Chest radiograph Sep 01, 2011  FINDINGS: Cardiac silhouette is unremarkable and unchanged. Tortuous and possibly ectatic aorta is similar. Mild fullness of the pulmonary hilar are unchanged, and may reflect pulmonary arterial hypertension. Mild chronic interstitial changes within increased lung volumes. Minimal strandy densities in the lung bases. No pleural effusions or focal consolidations. No pneumothorax.  Tiny calcific densities project over right scapula and could reflect phleboliths, less likely loose bodies. Stable sclerotic nonspecific lesion in left humeral metadiaphysis. Multiple EKG lines overlie the patient and may obscure subtle underlying pathology.  IMPRESSION: Stable cardiomegaly and COPD with bibasilar atelectasis.   Electronically Signed   By: Elon Alas   On: 04/09/2013 01:16        Brinley Rosete A. Merlene Laughter, M.D.  Diplomate, Tax adviser of Psychiatry and Neurology ( Neurology). 04/09/2013, 4:55 PM

## 2013-04-09 NOTE — Progress Notes (Signed)
Inpatient Diabetes Program Recommendations  AACE/ADA: New Consensus Statement on Inpatient Glycemic Control (2013)  Target Ranges:  Prepandial:   less than 140 mg/dL      Peak postprandial:   less than 180 mg/dL (1-2 hours)      Critically ill patients:  140 - 180 mg/dL   Results for [name], Jesus H (MRN [id number]) as of 04/09/2013 10:24  Ref. Range 04/09/2013 00:53  Glucose Latest Range: 70-99 mg/dL 865224 (H)   Inpatient Diabetes Program Recommendations IV fluids: Noted patient is ordered D5NS @ 100 ml/hr. Correction (SSI): Please consider ordering CBGs with Novolog correction scale ACHS. HgbA1C: Please consider ordering an A1C to evaluate glycemic control over the past 2-3 months.  Note: Patient does not have a documented history of diabetes.  Noted initial lab glucose was 224 mg/dl on 7/8/46961/10/2013 at 29:5200:53.  Patient is chronically on Prednisone 20 mg BID and is currently ordered Prednisone 20 mg BID as an inpatient which is likely cause of hyperglycemia.  Please order an A1C and CBGs with Novolog correction ACHS while inpatient.  Will continue to follow.  Thanks, Orlando PennerMarie Rylei Masella, RN, MSN, CCRN Diabetes Coordinator Inpatient Diabetes Program [phone] (Team Pager) [phone] ([facility] office) [phone] [facility] office)

## 2013-04-09 NOTE — ED Provider Notes (Signed)
CSN: [id number]     Arrival date & time 04/09/13  0024 History   First MD Initiated Contact with Patient 04/09/13 0030     Chief Complaint  Patient presents with  . Tremors   (Consider location/radiation/quality/duration/timing/severity/associated sxs/prior Treatment) HPI Patient with chronic daily alcohol abuse last drank alcohol this morning at 9 AM. Began having convulsions primarily on his left side this evening and EMS was called. Patient to give details of his history do to acute nature of his illness. Of 5 caveat applies. Past Medical History  Diagnosis Date  . COPD (chronic obstructive pulmonary disease)   . Stroke   . Hypertension   . Gout   . Alcohol abuse   . Seizures    History reviewed. No pertinent past surgical history. History reviewed. No pertinent family history. History  Substance Use Topics  . Smoking status: Current Every Day Smoker -- 1.00 packs/day    Types: Cigarettes  . Smokeless tobacco: Not on file  . Alcohol Use: Yes     Comment: liquor daily    Review of Systems  Unable to perform ROS   Allergies  Review of patient's allergies indicates no known allergies.  Home Medications   Current Outpatient Rx  Name  Route  Sig  Dispense  Refill  . amLODipine (NORVASC) 2.5 MG tablet   Oral   Take 1 tablet (2.5 mg total) by mouth daily.   30 tablet   0   . ibuprofen (ADVIL,MOTRIN) 200 MG tablet   Oral   Take 400 mg by mouth every 6 (six) hours as needed for pain. For gout symptoms         . levETIRAcetam (KEPPRA) 500 MG tablet   Oral   Take 1 tablet (500 mg total) by mouth 2 (two) times daily.   60 tablet   0   . Multiple Vitamin (MULTIVITAMIN WITH MINERALS) TABS tablet   Oral   Take 1 tablet by mouth daily.         . predniSONE (DELTASONE) 20 MG tablet   Oral   Take 1 tablet (20 mg total) by mouth 2 (two) times daily.   10 tablet   0    Pulse 129  Resp 19  SpO2 100% Physical Exam  Nursing note and vitals  reviewed. Constitutional: He appears well-developed and well-nourished. He appears distressed.  HENT:  Head: Normocephalic and atraumatic.  Mouth/Throat: Oropharynx is clear and moist.  Convulsion of the left side of patient's face  Eyes: EOM are normal. Pupils are equal, round, and reactive to light.  Neck: Normal range of motion. Neck supple.  Cardiovascular: Regular rhythm.   Tachycardia  Pulmonary/Chest: No respiratory distress. He has wheezes. He has no rales.  Increased respiratory effort with scattered wheezes and crackles in bilateral bases  Abdominal: Soft. Bowel sounds are normal. He exhibits no distension and no mass. There is no tenderness. There is no rebound and no guarding.  Musculoskeletal: Normal range of motion. He exhibits no edema and no tenderness.  Patient has convulsive movements to his left upper and left lower extremity. His left upper extremity is contracted  Neurological: He is alert.  Patient is awake and able to follow commands. He is distressed with convulsions of the left upper and lower extremities.  Patient with normal strength in his right upper and right lower extremities. He is unable to control his left side.  Skin: Skin is warm and dry. No rash noted. No erythema.  Psychiatric: He has a  normal mood and affect. His behavior is normal.    ED Course  Procedures (including critical care time) Labs Review Labs Reviewed  CBC WITH DIFFERENTIAL - Abnormal; Notable for the following:    WBC 3.9 (*)    Neutrophils Relative % 84 (*)    Lymphs Abs 0.5 (*)    All other components within normal limits  PRO B NATRIURETIC PEPTIDE - Abnormal; Notable for the following:    Pro B Natriuretic peptide (BNP) 753.9 (*)    All other components within normal limits  COMPREHENSIVE METABOLIC PANEL - Abnormal; Notable for the following:    CO2 17 (*)    Glucose, Bld 224 (*)    Calcium 8.3 (*)    Albumin 2.9 (*)    AST 193 (*)    ALT 64 (*)    GFR calc non Af Amer 70  (*)    GFR calc Af Amer 81 (*)    All other components within normal limits  URINALYSIS, ROUTINE W REFLEX MICROSCOPIC - Abnormal; Notable for the following:    Specific Gravity, Urine >1.030 (*)    Hgb urine dipstick TRACE (*)    Protein, ur 30 (*)    All other components within normal limits  ETHANOL - Abnormal; Notable for the following:    Alcohol, Ethyl (B) 19 (*)    All other components within normal limits  URINE MICROSCOPIC-ADD ON - Abnormal; Notable for the following:    Bacteria, UA MANY (*)    Casts HYALINE CASTS (*)    All other components within normal limits  TROPONIN I  URINE RAPID DRUG SCREEN (HOSP PERFORMED)  CK   Imaging Review Dg Chest Port 1 View  04/09/2013   CLINICAL DATA:  Shortness of breath and seizure.  COPD.  EXAM: PORTABLE CHEST - 1 VIEW  COMPARISON:  Chest radiograph Sep 01, 2011  FINDINGS: Cardiac silhouette is unremarkable and unchanged. Tortuous and possibly ectatic aorta is similar. Mild fullness of the pulmonary hilar are unchanged, and may reflect pulmonary arterial hypertension. Mild chronic interstitial changes within increased lung volumes. Minimal strandy densities in the lung bases. No pleural effusions or focal consolidations. No pneumothorax.  Tiny calcific densities project over right scapula and could reflect phleboliths, less likely loose bodies. Stable sclerotic nonspecific lesion in left humeral metadiaphysis. Multiple EKG lines overlie the patient and may obscure subtle underlying pathology.  IMPRESSION: Stable cardiomegaly and COPD with bibasilar atelectasis.   Electronically Signed   By: Awilda Metro   On: 04/09/2013 01:16    EKG Interpretation   None       MDM  Patient presenting with alcohol withdrawal seizures that appear to be partial. He was given Ativan 1 mg x4 and loaded with Keppra. He's had resolution of his seizures. He is resting comfortably. Initially he was started on BiPAP to 2 respiratory distress with wheezing and  crackles. This improved in the BiPAP has been discontinued.  Discussed with Dr. Cristal Generous. Will admit to telemetry bed. We'll place on CIWA protocol and give Ativan when necessary.     Loren Racer, MD 04/09/13 [phone]

## 2013-04-10 ENCOUNTER — Inpatient Hospital Stay (HOSPITAL_COMMUNITY)
Admit: 2013-04-10 | Discharge: 2013-04-10 | Disposition: A | Discharge status: Home / Self Care | Payer: Medicare HMO | Attending: Neurology | Admitting: Neurology

## 2013-04-10 NOTE — Plan of Care (Signed)
Problem: Phase I Progression Outcomes Goal: Other Phase I Outcomes/Goals Outcome: Progressing EEG completed

## 2013-04-10 NOTE — Progress Notes (Signed)
EEG completed; results pending.    

## 2013-04-10 NOTE — Procedures (Signed)
[facility] Jianni Batten A. Gerilyn Pilgrimoonquah, MD     [email or web address]        NAMGermain Osgood:  Ruhe, Jajuan           ACCOUNT NO.:  [id number]  MEDICAL RECORD NO.:  [id number]  LOCATION:  EE                           FACILITY:  [facility]  PHYSICIAN:  Cleda Imel A. Gerilyn Pilgrimoonquah, M.D. DATE OF BIRTH:  [birth year]  DATE OF PROCEDURE: DATE OF DISCHARGE:                             EEG INTERPRETATION   INDICATIONS:  The patient is a 68 year old man who presents with recurrent clinical focal seizures with some confusion.  This is complex partial seizures.  MEDICATIONS:  Keppra, prednisone, amlodipine, aspirin, labetalol, and Ativan.  ANALYSIS:  A 16-channel recording using standard 10/20 measurement is carried out for approximately 20 minutes.  There is a low-voltage electrical posterior activity of 12 hertz, which attenuates with eye opening.  There is higher beta activity observed in the frontal areas. Awake and drowsy activities are recorded.  There is no focal or lateralized slowing.  Photic stimulation and hyperventilation were not carried out.  There is no epileptiform activity observed.  IMPRESSION:  Unremarkable recording of awake and drowsy states.     Auren Valdes A. Gerilyn Pilgrimoonquah, M.D.     KAD/MEDQ  D:  04/10/2013  T:  04/10/2013  Job:  [id number]

## 2013-04-10 NOTE — Progress Notes (Signed)
Subjective: Patient was admitted yesterday due to seizure disorder. Patient is a known case alcoholism. He is awake but confused and disoriented.  Objective: Vital signs in last 24 hours: Temp:  [97.5 F (36.4 C)-97.8 F (36.6 C)] 97.5 F (36.4 C) (01/08 0601) Pulse Rate:  [70-76] 71 (01/08 0601) Resp:  [14-16] 16 (01/08 0601) BP: (152-178)/(95-105) 178/100 mmHg (01/08 0601) SpO2:  [96 %-100 %] 100 % (01/08 0601) Weight:  [61.689 kg (136 lb)] 61.689 kg (136 lb) (01/07 0800) Weight change:  Last BM Date:  (unknown)  Intake/Output from previous day: 01/07 0701 - 01/08 0700 In: 1025 [P.O.:120; I.V.:905] Out: 1250 [Urine:1250]  PHYSICAL EXAM General appearance: alert and no distress Resp: diminished breath sounds bilaterally and rhonchi bilaterally Cardio: S1, S2 normal GI: soft, non-tender; bowel sounds normal; no masses,  no organomegaly Extremities: extremities normal, atraumatic, no cyanosis or edema  Lab Results:    @labtest @ [facility] No results found for this basename: PHART, PCO2, PO2ART, TCO2, HCO3,  in the last 72 hours CULTURES No results found for this or any previous visit (from the past 240 hour(s)). Studies/Results: Ct Head Wo Contrast  04/09/2013   CLINICAL DATA:  Tremor, possible seizure. History of stroke, hypertension and seizures.  EXAM: CT HEAD WITHOUT CONTRAST  TECHNIQUE: Contiguous axial images were obtained from the base of the skull through the vertex without intravenous contrast.  COMPARISON:  CT of the head December 16, 2012  FINDINGS: Right frontotemporal parietal encephalomalacia again noted with ex vacuo dilatation of the right lateral ventricle, right temporal horn. No hydrocephalus. Patchy supratentorial white matter hypodensities exclusive of the encephalomalacia may reflect chronic small vessel ischemic disease. No acute intra cranial hemorrhage, mass effect, midline shift nor large vascular territory infarct.  No abnormal extra-axial fluid  collections. Moderate calcific atherosclerosis of the carotid siphons. Poor dentition addition. Chronic paranasal sinusitis with near complete opacification left maxillary sinus. Periosteal Re absorption of the left maxilla partially imaged. Ocular globes and orbital contents are unremarkable though not tailored for evaluation. Left facial cutaneous nodule measures 10 mm, and though could reflect a sebaceous cyst, recommend direct inspection.  IMPRESSION: No acute intracranial process.  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 Moderate white matter changes suggest chronic small vessel ischemic disease.  Chronic paranasal sinusitis, with periosteal reabsorption of the left mandible, incompletely characterized.   Electronically Signed   By: Awilda Metroourtnay  Bloomer   On: 04/09/2013 04:16   Dg Chest Port 1 View  04/09/2013   CLINICAL DATA:  Shortness of breath and seizure.  COPD.  EXAM: PORTABLE CHEST - 1 VIEW  COMPARISON:  Chest radiograph Sep 01, 2011  FINDINGS: Cardiac silhouette is unremarkable and unchanged. Tortuous and possibly ectatic aorta is similar. Mild fullness of the pulmonary hilar are unchanged, and may reflect pulmonary arterial hypertension. Mild chronic interstitial changes within increased lung volumes. Minimal strandy densities in the lung bases. No pleural effusions or focal consolidations. No pneumothorax.  Tiny calcific densities project over right scapula and could reflect phleboliths, less likely loose bodies. Stable sclerotic nonspecific lesion in left humeral metadiaphysis. Multiple EKG lines overlie the patient and may obscure subtle underlying pathology.  IMPRESSION: Stable cardiomegaly and COPD with bibasilar atelectasis.   Electronically Signed   By: Awilda Metroourtnay  Bloomer   On: 04/09/2013 01:16    Medications: I have reviewed the patient's current medications.  Assesment: Alcohol withdrawal seizure  Seizure disorder  Hypertension  COPD  Active Problems:   Alcohol  withdrawal seizure    Plan:  Continue iv fluid Continue keppra Neurology consult pending    LOS: 1 day   Glendi Mohiuddin 04/10/2013, 7:42 AM

## 2013-04-10 NOTE — Progress Notes (Signed)
Patient ID: Jesus Lin, male   DOB: [birth year], 68 y.o.   MRN: [id number]  Radnor A. Merlene Laughter, MD     [email or web address]          Jesus Lin is an 68 y.o. male.   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 Seizure seems controlled.  Although right hemispheric infarct with left hemiparesis, left-sided neglect and left homonymous hemianopia. The patient is on aspirin for secondary stroke prevention.  He patient is laying in bed with his eyes and head turned towards the right. He is sleeping but easily arousable. He continues to have significant left-sided neglect and left homonymous hemianopia. No shaking activities observed.    Objective: Vital signs in last 24 hours: Temp:  [97.5 F (36.4 C)-98.9 F (37.2 C)] 98.9 F (37.2 C) (01/08 2013) Pulse Rate:  [71-85] 79 (01/08 2013) Resp:  [16] 16 (01/08 2013) BP: (125-178)/(65-100) 147/91 mmHg (01/08 2013) SpO2:  [98 %-100 %] 98 % (01/08 2013)  Intake/Output from previous day: 01/07 0701 - 01/08 0700 In: 1025 [P.O.:120; I.V.:905] Out: 1250 [Urine:1250] Intake/Output this shift:   Nutritional status: Dysphagia   Lab Results: Results for orders placed during the hospital encounter of 04/09/13 (from the past 48 hour(s))  CBC WITH DIFFERENTIAL     Status: Abnormal   Collection Time    04/09/13 12:53 AM      Result Value Range   WBC 3.9 (*) 4.0 - 10.5 K/uL   RBC 4.41  4.22 - 5.81 MIL/uL   Hemoglobin 13.4  13.0 - 17.0 g/dL   HCT 41.8  39.0 - 52.0 %   MCV 94.8  78.0 - 100.0 fL   MCH 30.4  26.0 - 34.0 pg   MCHC 32.1  30.0 - 36.0 g/dL   RDW 14.6  11.5 - 15.5 %   Platelets 284  150 - 400 K/uL   Neutrophils Relative % 84 (*) 43 - 77 %   Neutro Abs 3.3  1.7 - 7.7 K/uL   Lymphocytes Relative 13  12 - 46 %   Lymphs Abs 0.5 (*) 0.7 - 4.0 K/uL   Monocytes Relative 3  3 - 12 %   Monocytes Absolute 0.1  0.1 - 1.0 K/uL   Eosinophils Relative 0  0 - 5 %   Eosinophils  Absolute 0.0  0.0 - 0.7 K/uL   Basophils Relative 0  0 - 1 %   Basophils Absolute 0.0  0.0 - 0.1 K/uL  PRO B NATRIURETIC PEPTIDE     Status: Abnormal   Collection Time    04/09/13 12:53 AM      Result Value Range   Pro B Natriuretic peptide (BNP) 753.9 (*) 0 - 125 pg/mL  COMPREHENSIVE METABOLIC PANEL     Status: Abnormal   Collection Time    04/09/13 12:53 AM      Result Value Range   Sodium 145  137 - 147 mEq/L   Potassium 3.7  3.7 - 5.3 mEq/L   Chloride 101  96 - 112 mEq/L   CO2 17 (*) 19 - 32 mEq/L   Glucose, Bld 224 (*) 70 - 99 mg/dL   BUN 21  6 - 23 mg/dL   Creatinine, Ser 1.08  0.50 - 1.35 mg/dL   Calcium 8.3 (*) 8.4 - 10.5 mg/dL   Total Protein 7.5  6.0 - 8.3 g/dL   Albumin 2.9 (*) 3.5 - 5.2 g/dL   AST 193 (*) 0 - 37 U/L  ALT 64 (*) 0 - 53 U/L   Alkaline Phosphatase 111  39 - 117 U/L   Total Bilirubin 0.3  0.3 - 1.2 mg/dL   GFR calc non Af Amer 70 (*) >90 mL/min   GFR calc Af Amer 81 (*) >90 mL/min   Comment: (NOTE)     The eGFR has been calculated using the CKD EPI equation.     This calculation has not been validated in all clinical situations.     eGFR's persistently <90 mL/min signify possible Chronic Kidney     Disease.  TROPONIN I     Status: None   Collection Time    04/09/13 12:53 AM      Result Value Range   Troponin I <0.30  <0.30 ng/mL   Comment:            Due to the release kinetics of cTnI,     a negative result within the first hours     of the onset of symptoms does not rule out     myocardial infarction with certainty.     If myocardial infarction is still suspected,     repeat the test at appropriate intervals.  ETHANOL     Status: Abnormal   Collection Time    04/09/13 12:53 AM      Result Value Range   Alcohol, Ethyl (B) 19 (*) 0 - 11 mg/dL   Comment:            LOWEST DETECTABLE LIMIT FOR     SERUM ALCOHOL IS 11 mg/dL     FOR MEDICAL PURPOSES ONLY  CK     Status: None   Collection Time    04/09/13 12:53 AM      Result Value Range     Total CK 52  7 - 232 U/L  URINALYSIS, ROUTINE W REFLEX MICROSCOPIC     Status: Abnormal   Collection Time    04/09/13  1:32 AM      Result Value Range   Color, Urine YELLOW  YELLOW   APPearance CLEAR  CLEAR   Specific Gravity, Urine >1.030 (*) 1.005 - 1.030   pH 6.0  5.0 - 8.0   Glucose, UA NEGATIVE  NEGATIVE mg/dL   Hgb urine dipstick TRACE (*) NEGATIVE   Bilirubin Urine NEGATIVE  NEGATIVE   Ketones, ur NEGATIVE  NEGATIVE mg/dL   Protein, ur 30 (*) NEGATIVE mg/dL   Urobilinogen, UA 0.2  0.0 - 1.0 mg/dL   Nitrite NEGATIVE  NEGATIVE   Leukocytes, UA NEGATIVE  NEGATIVE  URINE RAPID DRUG SCREEN (HOSP PERFORMED)     Status: None   Collection Time    04/09/13  1:32 AM      Result Value Range   Opiates NONE DETECTED  NONE DETECTED   Cocaine NONE DETECTED  NONE DETECTED   Benzodiazepines NONE DETECTED  NONE DETECTED   Amphetamines NONE DETECTED  NONE DETECTED   Tetrahydrocannabinol NONE DETECTED  NONE DETECTED   Barbiturates NONE DETECTED  NONE DETECTED   Comment:            DRUG SCREEN FOR MEDICAL PURPOSES     ONLY.  IF CONFIRMATION IS NEEDED     FOR ANY PURPOSE, NOTIFY LAB     WITHIN 5 DAYS.                LOWEST DETECTABLE LIMITS     FOR URINE DRUG SCREEN     Drug Class  Cutoff (ng/mL)     Amphetamine      1000     Barbiturate      200     Benzodiazepine   637     Tricyclics       858     Opiates          300     Cocaine          300     THC              50  URINE MICROSCOPIC-ADD ON     Status: Abnormal   Collection Time    04/09/13  1:32 AM      Result Value Range   Squamous Epithelial / LPF RARE  RARE   WBC, UA 0-2  <3 WBC/hpf   RBC / HPF 0-2  <3 RBC/hpf   Bacteria, UA MANY (*) RARE   Casts HYALINE CASTS (*) NEGATIVE   Comment: GRANULAR CAST   Urine-Other MUCOUS PRESENT      Lipid Panel No results found for this basename: CHOL, TRIG, HDL, CHOLHDL, VLDL, LDLCALC,  in the last 72 hours  Studies/Results: Ct Head Wo Contrast  04/09/2013   CLINICAL DATA:   Tremor, possible seizure. History of stroke, hypertension and seizures.  EXAM: CT HEAD WITHOUT CONTRAST  TECHNIQUE: Contiguous axial images were obtained from the base of the skull through the vertex without intravenous contrast.  COMPARISON:  CT of the head December 16, 2012  FINDINGS: Right frontotemporal parietal encephalomalacia again noted with ex vacuo dilatation of the right lateral ventricle, right temporal horn. No hydrocephalus. Patchy supratentorial white matter hypodensities exclusive of the encephalomalacia may reflect chronic small vessel ischemic disease. No acute intra cranial hemorrhage, mass effect, midline shift nor large vascular territory infarct.  No abnormal extra-axial fluid collections. Moderate calcific atherosclerosis of the carotid siphons. Poor dentition addition. Chronic paranasal sinusitis with near complete opacification left maxillary sinus. Periosteal Re absorption of the left maxilla partially imaged. Ocular globes and orbital contents are unremarkable though not tailored for evaluation. Left facial cutaneous nodule measures 10 mm, and though could reflect a sebaceous cyst, recommend direct inspection.  IMPRESSION: No acute intracranial process.  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 Moderate white matter changes suggest chronic small vessel ischemic disease.  Chronic paranasal sinusitis, with periosteal reabsorption of the left mandible, incompletely characterized.   Electronically Signed   By: Elon Alas   On: 04/09/2013 04:16   Dg Chest Port 1 View  04/09/2013   CLINICAL DATA:  Shortness of breath and seizure.  COPD.  EXAM: PORTABLE CHEST - 1 VIEW  COMPARISON:  Chest radiograph Sep 01, 2011  FINDINGS: Cardiac silhouette is unremarkable and unchanged. Tortuous and possibly ectatic aorta is similar. Mild fullness of the pulmonary hilar are unchanged, and may reflect pulmonary arterial hypertension. Mild chronic interstitial changes within increased  lung volumes. Minimal strandy densities in the lung bases. No pleural effusions or focal consolidations. No pneumothorax.  Tiny calcific densities project over right scapula and could reflect phleboliths, less likely loose bodies. Stable sclerotic nonspecific lesion in left humeral metadiaphysis. Multiple EKG lines overlie the patient and may obscure subtle underlying pathology.  IMPRESSION: Stable cardiomegaly and COPD with bibasilar atelectasis.   Electronically Signed   By: Elon Alas   On: 04/09/2013 01:16    Medications:  Scheduled Meds: . amLODipine  2.5 mg Oral Daily  . aspirin  325 mg Oral Daily  . heparin  5,000  Units Subcutaneous Q8H  . levETIRAcetam  1,000 mg Oral BID  . predniSONE  20 mg Oral BID  . thiamine  100 mg Intravenous Daily   Continuous Infusions: . dextrose 5 % and 0.9% NaCl 100 mL/hr at 04/10/13 1812   PRN Meds:.labetalol, LORazepam     LOS: 1 day   Cohen Boettner A. Merlene Laughter, M.D.  Diplomate, Tax adviser of Psychiatry and Neurology ( Neurology).

## 2013-04-11 NOTE — Progress Notes (Signed)
Subjective: Patient is awake but very weak and confused.  Objective: Vital signs in last 24 hours: Temp:  [98.1 F (36.7 C)-98.9 F (37.2 C)] 98.1 F (36.7 C) (01/09 0553) Pulse Rate:  [71-85] 72 (01/09 0553) Resp:  [16] 16 (01/09 0553) BP: (125-179)/(65-101) 179/101 mmHg (01/09 0553) SpO2:  [98 %-100 %] 100 % (01/09 0553) Weight change:  Last BM Date:  (unknown)  Intake/Output from previous day: 01/08 0701 - 01/09 0700 In: 1200 [I.V.:1200] Out: 750 [Urine:750]  PHYSICAL EXAM General appearance: alert and no distress Resp: diminished breath sounds bilaterally and rhonchi bilaterally Cardio: S1, S2 normal GI: soft, non-tender; bowel sounds normal; no masses,  no organomegaly Extremities: extremities normal, atraumatic, no cyanosis or edema  Lab Results:    @labtest @ [facility] No results found for this basename: PHART, PCO2, PO2ART, TCO2, HCO3,  in the last 72 hours CULTURES No results found for this or any previous visit (from the past 240 hour(s)). Studies/Results: No results found.  Medications: I have reviewed the patient's current medications.  Assesment: Alcohol withdrawal seizure  Seizure disorder  Hypertension  COPD  Active Problems:   Alcohol withdrawal seizure    Plan: Continue iv fluid Continue keppra Neurology consult appreciated. Will do CBC and BMP   LOS: 2 days   Thereasa Iannello 04/11/2013, 7:45 AM

## 2013-04-11 NOTE — Progress Notes (Signed)
Patient ID: Jesus Lin, male   DOB: 06/21/1945, 68 y.o.   MRN: [id number]  [facility] Boomer Winders A. Gerilyn Pilgrimoonquah, MD     [email or web address]          Jesus Lin is an 68 y.o. male.   Assessment/Plan: Epilepsy. Doing well with high dose of Keppra. 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 Continue with aspirin.  Alcoholism. The patient is on thiamine.    Objective: Vital signs in last 24 hours: Temp:  [97.8 F (36.6 C)-98.9 F (37.2 C)] 97.8 F (36.6 C) (01/09 1446) Pulse Rate:  [70-79] 70 (01/09 1446) Resp:  [16-18] 18 (01/09 1446) BP: (145-179)/(85-101) 145/85 mmHg (01/09 1446) SpO2:  [98 %-100 %] 98 % (01/09 1446)  Intake/Output from previous day: 01/08 0701 - 01/09 0700 In: 2363.3 [I.V.:2363.3] Out: 750 [Urine:750] Intake/Output this shift:   Nutritional status: Dysphagia   Lab Results: No results found for this or any previous visit (from the past 48 hour(s)).  Lipid Panel No results found for this basename: CHOL, TRIG, HDL, CHOLHDL, VLDL, LDLCALC,  in the last 72 hours  Studies/Results: No results found.  Medications:  Scheduled Meds: . amLODipine  2.5 mg Oral Daily  . aspirin  325 mg Oral Daily  . heparin  5,000 Units Subcutaneous Q8H  . levETIRAcetam  1,000 mg Oral BID  . predniSONE  20 mg Oral BID  . thiamine  100 mg Intravenous Daily   Continuous Infusions: . dextrose 5 % and 0.9% NaCl 100 mL/hr at 04/11/13 1716   PRN Meds:.labetalol, LORazepam    LOS: 2 days   Yida Hyams A. Gerilyn Pilgrimoonquah, M.D.  Diplomate, Biomedical engineerAmerican Board of Psychiatry and Neurology ( Neurology).

## 2013-04-11 NOTE — Clinical Social Work Note (Addendum)
CSW presented bed offer at [facility] and pt accepts. [facility] notified and will fax authorization to facility. Okay for d/c this weekend if stable. Discussed with sister at bedside as well.   Derenda FennelKara Enola Siebers, Kentucky[place]CSW [phone]

## 2013-04-11 NOTE — Care Management Note (Addendum)
    Page 1 of 1   04/14/2013     8:50:54 AM   CARE MANAGEMENT NOTE 04/14/2013  Patient:  [name],Ahmon H   Account Number:  [id number]  Date Initiated:  04/11/2013  Documentation initiated by:  Sharrie RothmanBLACKWELL,Marilene Vath C  Subjective/Objective Assessment:   Pt admitted from home with seizures. Pt lives with his sister and nephew. Pts sister states that pt is in and out of the home. Pt stated that he drinks about 1/2 pint a day.     Action/Plan:   PT to evaulate pt. Pt stated he will return home at discharge. Will continue to follow for discharge planning needs.   Anticipated DC Date:  04/14/2013   Anticipated DC Plan:  HOME/SELF CARE      DC Planning Services  CM consult      Choice offered to / List presented to:             Status of service:  Completed, signed off Medicare Important Message given?  YES (If response is "NO", the following Medicare IM given date fields will be blank) Date Medicare IM given:  04/14/2013 Date Additional Medicare IM given:    Discharge Disposition:  SKILLED NURSING FACILITY  Per UR Regulation:    If discussed at Long Length of Stay Meetings, dates discussed:    Comments:  04/14/13 0850 Arlyss Queenammy Cormick Moss, RN BSN CM Pt discharged to [facility] today. CSW to arrange discharge to facility.  04/11/13 1116 Arlyss Queenammy Crystallee Werden, RN BSN CM

## 2013-04-11 NOTE — Evaluation (Signed)
Physical Therapy Evaluation Patient Details Name: Jesus Lin MRN: [id number] DOB: 08/17/1945 Today's Date: 04/11/2013 Time: [id number] PT Time Calculation (min): 67 min  PT Assessment / Plan / Recommendation History of Present Illness  Pt with a hx of COPD, stoke with left hemiparesis, chronic ETOH and cocaine abuse and gout is admitted due to seizure.  He lives with family with his bedroom in the basement level.  He states that he does not use AD for gait and is independent in ADLs.  Clinical Impression   Pt has been seen for evaluation and found to have a profound loss of function as compared to his baseline.  He now needs max assist to sit due to severe balance deficit.  He required total assist to transfer bed to chair.  It is clear that he will not be able to manage at home upon d/c and will need SNF for attempt at regaining his mobility.    PT Assessment  Patient needs continued PT services    Follow Up Recommendations  SNF    Does the patient have the potential to tolerate intense rehabilitation      Barriers to Discharge Inaccessible home environment;Decreased caregiver support      Equipment Recommendations  None recommended by PT    Recommendations for Other Services OT consult   Frequency Min 3X/week    Precautions / Restrictions Precautions Precautions: Fall Restrictions Weight Bearing Restrictions: No   Pertinent Vitals/Pain       Mobility  Bed Mobility Overal bed mobility: Needs Assistance Bed Mobility: Supine to Sit Supine to sit: Mod assist Transfers Overall transfer level: Needs assistance Equipment used: 1 person hand held assist Transfers: Stand Pivot Transfers Stand pivot transfers: Total assist General transfer comment: falls severely to the left during transfer    Exercises     PT Diagnosis: Difficulty walking;Hemiplegia non-dominant side;Generalized weakness  PT Problem List: Decreased strength;Decreased activity tolerance;Decreased  balance;Decreased mobility;Impaired tone PT Treatment Interventions: Functional mobility training;Therapeutic exercise;Neuromuscular re-education;Balance training     PT Goals(Current goals can be found in the care plan section) Acute Rehab PT Goals Patient Stated Goal: none stated PT Goal Formulation: With patient Time For Goal Achievement: 04/11/13 Potential to Achieve Goals: Fair  Visit Information  Last PT Received On: 04/11/13 History of Present Illness: Pt with a hx of COPD, stoke with left hemiparesis, chronic ETOH and cocaine abuse and gout is admitted due to seizure.  He lives with family with his bedroom in the basement level.  He states that he does not use AD for gait and is independent in ADLs.       Prior Functioning  Home Living Family/patient expects to be discharged to:: Skilled nursing facility Prior Function Level of Independence: Independent Communication Communication: No difficulties    Cognition  Cognition Arousal/Alertness: Awake/alert Behavior During Therapy: WFL for tasks assessed/performed Overall Cognitive Status: Within Functional Limits for tasks assessed    Extremity/Trunk Assessment Upper Extremity Assessment Upper Extremity Assessment: RUE deficits/detail RUE Deficits / Details: no active motion, in flexed position Lower Extremity Assessment Lower Extremity Assessment: RLE deficits/detail RLE Deficits / Details: strong extensor tone with minimal active motion. Cervical / Trunk Assessment Cervical / Trunk Assessment: Other exceptions Cervical / Trunk Exceptions: increased  tone in trunk   Balance Balance Overall balance assessment: Needs assistance Sitting-balance support: No upper extremity supported;Feet supported Sitting balance-Leahy Scale: Zero  End of Session PT - End of Session Equipment Utilized During Treatment: Gait belt Activity Tolerance: Patient limited by fatigue  Patient left: in chair;with call bell/phone within  reach Nurse Communication: Mobility status  GP     Konrad Penta 04/11/2013, 3:51 PM

## 2013-04-11 NOTE — Clinical Social Work Placement (Signed)
Clinical Social Work Department CLINICAL SOCIAL WORK PLACEMENT NOTE 04/11/2013  Patient:  [name],Jermon H  Account Number:  [id number] Admit date:  04/09/2013  Clinical Social Worker:  Derenda FennelKARA Kaoir Loree, LCSW  Date/time:  04/11/2013 04:25 PM  Clinical Social Work is seeking post-discharge placement for this patient at the following level of care:   SKILLED NURSING   (*CSW will update this form in Epic as items are completed)   04/11/2013  Patient/family provided with [facility] Department of Clinical Social Work's list of facilities offering this level of care within the geographic area requested by the patient (or if unable, by the patient's family).  04/11/2013  Patient/family informed of their freedom to choose among providers that offer the needed level of care, that participate in Medicare, Medicaid or managed care program needed by the patient, have an available bed and are willing to accept the patient.  04/11/2013  Patient/family informed of [facility]' ownership interest in [facility], as well as of the fact that they are under no obligation to receive care at this facility.  PASARR submitted to EDS on 04/11/2013 PASARR number received from EDS on 04/11/2013  FL2 transmitted to all facilities in geographic area requested by pt/family on  04/11/2013 FL2 transmitted to all facilities within larger geographic area on   Patient informed that his/her managed care company has contracts with or will negotiate with  certain facilities, including the following:     Patient/family informed of bed offers received:  04/11/2013 [facility] [place] [facility] [place]  Patient to be transferred to  on   Patient to be transferred to facility by   The following physician request were entered in Epic:   Additional Comments:  Derenda FennelKara Traylen Eckels, LCSW [phone]

## 2013-04-11 NOTE — Clinical Social Work Psychosocial (Signed)
Clinical Social Work Department BRIEF PSYCHOSOCIAL ASSESSMENT 04/11/2013  Patient:  Jesus Lin, Jesus Lin     Account Number:  [id number]     Admit date:  04/09/2013  Clinical Social Worker:  Wyatt Haste  Date/Time:  04/11/2013 04:25 PM  Referred by:  CSW  Date Referred:  04/11/2013 Referred for  SNF Placement   Other Referral:   Interview type:  Patient Other interview type:    PSYCHOSOCIAL DATA Living Status:  SIBLING Admitted from facility:   Level of care:   Primary support name:  Major Primary support relationship to patient:  SIBLING Degree of support available:   supportive per pt    CURRENT CONCERNS Current Concerns  Post-Acute Placement   Other Concerns:    SOCIAL WORK ASSESSMENT / PLAN CSW met with pt at bedside. Pt alert and oriented and reports he lives with his sister. However, he reports his best support is his brother. Pt admitted with alcohol related seizures. He is generally independent at baseline. PT evaluated pt and indicates that pt is profoundly weak. He is open to SNF at d/c. CSW discussed placement process and pt wants to stay in [place]. Notified [place] with [employer] and they will call authorization to facility selected. SNF list provided.    CSW briefly discussed ETOH use. He reports he drinks a 1/2 pint of gin daily. Pt has not been successful with long term sobriety and he is also hopeful that a SNF stay will help him. He admits that this is a problem for him.   Assessment/plan status:  Psychosocial Support/Ongoing Assessment of Needs Other assessment/ plan:   Information/referral to community resources:   SNF list    PATIENT'S/FAMILY'S RESPONSE TO PLAN OF CARE: CSW initiated bed search and [employer] authorization and will follow up with bed offers in anticipation of possible d/c this weekend. Pt reports very positive feelings regarding [facility] for rehab.       Benay Pike, [place]

## 2013-04-11 NOTE — Clinical Social Work Placement (Signed)
Clinical Social Work Department CLINICAL SOCIAL WORK PLACEMENT NOTE 04/11/2013  Patient:  Jesus Lin,Jesus Lin  Account Number:  [id number] Admit date:  04/09/2013  Clinical Social Worker:  Derenda FennelKARA Erikka Follmer, LCSW  Date/time:  04/11/2013 04:25 PM  Clinical Social Work is seeking post-discharge placement for this patient at the following level of care:   SKILLED NURSING   (*CSW will update this form in Epic as items are completed)   04/11/2013  Patient/family provided with [facility] Department of Clinical Social Work's list of facilities offering this level of care within the geographic area requested by the patient (or if unable, by the patient's family).  04/11/2013  Patient/family informed of their freedom to choose among providers that offer the needed level of care, that participate in Medicare, Medicaid or managed care program needed by the patient, have an available bed and are willing to accept the patient.  04/11/2013  Patient/family informed of [facility] ownership interest in [facility], as well as of the fact that they are under no obligation to receive care at this facility.  PASARR submitted to EDS on 04/11/2013 PASARR number received from EDS on 04/11/2013  FL2 transmitted to all facilities in geographic area requested by pt/family on  04/11/2013 FL2 transmitted to all facilities within larger geographic area on   Patient informed that his/her managed care company has contracts with or will negotiate with  certain facilities, including the following:     Patient/family informed of bed offers received:   Patient chooses bed at  Physician recommends and patient chooses bed at    Patient to be transferred to  on   Patient to be transferred to facility by   The following physician request were entered in Epic:   Additional Comments:  Derenda FennelKara Tiffay Pinette, LCSW [phone]

## 2013-04-12 LAB — BASIC METABOLIC PANEL
BUN: 16 mg/dL (ref 6–23)
CALCIUM: 7.9 mg/dL — AB (ref 8.4–10.5)
CO2: 26 mEq/L (ref 19–32)
Chloride: 104 mEq/L (ref 96–112)
Creatinine, Ser: 0.83 mg/dL (ref 0.50–1.35)
GFR calc non Af Amer: 90 mL/min — ABNORMAL LOW (ref >90)
Glucose, Bld: 120 mg/dL — ABNORMAL HIGH (ref 70–99)
Potassium: 4.4 mEq/L (ref 3.7–5.3)
SODIUM: 140 meq/L (ref 137–147)

## 2013-04-12 LAB — CBC
HCT: 39.8 % (ref 39.0–52.0)
Hemoglobin: 13 g/dL (ref 13.0–17.0)
MCH: 30.4 pg (ref 26.0–34.0)
MCHC: 32.7 g/dL (ref 30.0–36.0)
MCV: 93.2 fL (ref 78.0–100.0)
PLATELETS: 200 10*3/uL (ref 150–400)
RBC: 4.27 MIL/uL (ref 4.22–5.81)
RDW: 14.5 % (ref 11.5–15.5)
WBC: 5.5 10*3/uL (ref 4.0–10.5)

## 2013-04-12 MED ORDER — ACETAMINOPHEN 500 MG PO TABS
500.0000 mg | ORAL_TABLET | ORAL | Status: DC | PRN
  Administered 2013-04-12 – 2013-04-13 (×2): 500 mg via ORAL
  Filled 2013-04-12 (×2): qty 1

## 2013-04-12 NOTE — Progress Notes (Signed)
Subjective: Patient is more awake but still remained confused and disoriented. Objective: Vital signs in last 24 hours: Temp:  [97.8 F (36.6 C)-98.6 F (37 C)] 98.6 F (37 C) (01/10 0546) Pulse Rate:  [69-70] 69 (01/10 0546) Resp:  [17-19] 19 (01/10 0546) BP: (145-160)/(85-96) 160/91 mmHg (01/10 0546) SpO2:  [98 %-100 %] 98 % (01/10 0546) Weight change:  Last BM Date: 04/09/13  Intake/Output from previous day: 01/09 0701 - 01/10 0700 In: 2770 [P.O.:360; I.V.:2410] Out: 750 [Urine:750]  PHYSICAL EXAM General appearance: alert and no distress Resp: diminished breath sounds bilaterally and rhonchi bilaterally Cardio: S1, S2 normal GI: soft, non-tender; bowel sounds normal; no masses,  no organomegaly Extremities: extremities normal, atraumatic, no cyanosis or edema  Lab Results:    @labtest @ [facility] No results found for this basename: PHART, PCO2, PO2ART, TCO2, HCO3,  in the last 72 hours CULTURES No results found for this or any previous visit (from the past 240 hour(s)). Studies/Results: No results found.  Medications: I have reviewed the patient's current medications.  Assesment: Alcohol withdrawal seizure  Seizure disorder  Hypertension  COPD  Active Problems:   Alcohol withdrawal seizure    Plan: Will decrease  iv fluid Continue keppra Continue PT  LOS: 3 days   Jesus Lin 04/12/2013, 10:07 AM

## 2013-04-13 NOTE — Progress Notes (Signed)
Subjective: Patient is awake but confused. He is working with physical theray. Objective: Vital signs in last 24 hours: Temp:  [97.7 F (36.5 C)-98.7 F (37.1 C)] 98.7 F (37.1 C) (01/11 0436) Pulse Rate:  [63-72] 63 (01/11 0436) Resp:  [18-20] 18 (01/11 0436) BP: (135-170)/(89-92) 160/91 mmHg (01/11 0436) SpO2:  [98 %-100 %] 98 % (01/11 0436) Weight change:  Last BM Date: 04/09/13  Intake/Output from previous day: 01/10 0701 - 01/11 0700 In: 1905 [P.O.:360; I.V.:1545] Out: 250 [Urine:250]  PHYSICAL EXAM General appearance: alert and no distress Resp: diminished breath sounds bilaterally and rhonchi bilaterally Cardio: S1, S2 normal GI: soft, non-tender; bowel sounds normal; no masses,  no organomegaly Extremities: extremities normal, atraumatic, no cyanosis or edema  Lab Results:    @labtest @ [facility] No results found for this basename: PHART, PCO2, PO2ART, TCO2, HCO3,  in the last 72 hours CULTURES No results found for this or any previous visit (from the past 240 hour(s)). Studies/Results: No results found.  Medications: I have reviewed the patient's current medications.  Assesment: Alcohol withdrawal seizure  Seizure disorder  Hypertension  COPD  Active Problems:   Alcohol withdrawal seizure    Plan: Will decrease  iv fluid Continue keppra Continue PT D/C foley catheter  LOS: 4 days   Jesus Lin 04/13/2013, 10:14 AM

## 2013-04-14 MED ORDER — VITAMIN B-1 100 MG PO TABS: 100.0000 mg | ORAL_TABLET | Freq: Every day | ORAL | Status: AC

## 2013-04-14 MED ORDER — PREDNISONE (PAK) 10 MG PO TABS: ORAL_TABLET | Freq: Every day | ORAL | Status: DC

## 2013-04-14 MED ORDER — LABETALOL HCL 5 MG/ML IV SOLN: 10.0000 mg | INTRAVENOUS | Status: DC | PRN

## 2013-04-14 MED ORDER — ASPIRIN 325 MG PO TABS: 325.0000 mg | ORAL_TABLET | Freq: Every day | ORAL | Status: DC

## 2013-04-14 MED ORDER — AMLODIPINE BESYLATE 2.5 MG PO TABS: 2.5000 mg | ORAL_TABLET | Freq: Every day | ORAL | Status: DC

## 2013-04-14 NOTE — Progress Notes (Signed)
Report called to Katha CabalPat Rhodes, LPN of [facility].  She verbalized understanding and was told she could call me back today with any further questions after the patient arrives.  The patient left the floor with his packet to be transported by EMS to the facility in stable condition.

## 2013-04-14 NOTE — Clinical Social Work Note (Signed)
Pt d/c today to [facility]. CSW attempted to meet with pt to complete SBIRT and discuss substance abuse further, but pt sleeping soundly. Family and facility notified and agreeable. Pt to transport via [facility]. D/C summary faxed.   Derenda FennelKara Palmira Stickle, Kentucky[place]CSW [phone]

## 2013-04-14 NOTE — Clinical Social Work Placement (Signed)
Clinical Social Work Department CLINICAL SOCIAL WORK PLACEMENT NOTE 04/14/2013  Patient:  Jesus Lin,Jesus Lin  Account Number:  [id number] Admit date:  04/09/2013  Clinical Social Worker:  Derenda FennelKARA Natajah Derderian, LCSW  Date/time:  04/11/2013 04:25 PM  Clinical Social Work is seeking post-discharge placement for this patient at the following level of care:   SKILLED NURSING   (*CSW will update this form in Epic as items are completed)   04/11/2013  Patient/family provided with [facility] Department of Clinical Social Work's list of facilities offering this level of care within the geographic area requested by the patient (or if unable, by the patient's family).  04/11/2013  Patient/family informed of their freedom to choose among providers that offer the needed level of care, that participate in Medicare, Medicaid or managed care program needed by the patient, have an available bed and are willing to accept the patient.  04/11/2013  Patient/family informed of [facility] ownership interest in [facility], as well as of the fact that they are under no obligation to receive care at this facility.  PASARR submitted to EDS on 04/11/2013 PASARR number received from EDS on 04/11/2013  FL2 transmitted to all facilities in geographic area requested by pt/family on  04/11/2013 FL2 transmitted to all facilities within larger geographic area on   Patient informed that his/her managed care company has contracts with or will negotiate with  certain facilities, including the following:     Patient/family informed of bed offers received:  04/11/2013 [facility] [place] [facility] and [facility] [place]  [facility] [place] on  04/14/2013 [facility]  The following physician request were entered in Epic:   Additional Comments:  Derenda FennelKara Caldwell Kronenberger, LCSW [phone]

## 2013-04-14 NOTE — Discharge Summary (Signed)
Physician Discharge Summary  Patient ID: Jesus Lin H Bodiford MRN: [id number] DOB/AGE: 67/05/1945 67 y.o. Primary Care Physician:Icey Tello, MD Admit date: 04/09/2013 Discharge date: 04/14/2013    Discharge Diagnoses:  Alcohol withdrawal seizure  Seizure disorder  Hypertension  COPD  Active Problems:   Alcohol withdrawal seizure     Medication List    STOP taking these medications       predniSONE 20 MG tablet  Commonly known as:  DELTASONE  Replaced by:  predniSONE 10 MG tablet      TAKE these medications       amLODipine 2.5 MG tablet  Commonly known as:  NORVASC  Take 1 tablet (2.5 mg total) by mouth daily.     aspirin 325 MG tablet  Take 1 tablet (325 mg total) by mouth daily.     labetalol 5 MG/ML injection  Commonly known as:  NORMODYNE,TRANDATE  Inject 2 mLs (10 mg total) into the vein every 2 (two) hours as needed.     levETIRAcetam 500 MG tablet  Commonly known as:  KEPPRA  Take 1 tablet (500 mg total) by mouth 2 (two) times daily.     predniSONE 10 MG tablet  Commonly known as:  STERAPRED UNI-PAK  Take by mouth daily.     thiamine 100 MG tablet  Commonly known as:  VITAMIN B-1  Take 1 tablet (100 mg total) by mouth daily.        Discharged Condition: improved    Consults: neurology  Significant Diagnostic Studies: Ct Head Wo Contrast  04/09/2013   CLINICAL DATA:  Tremor, possible seizure. History of stroke, hypertension and seizures.  EXAM: CT HEAD WITHOUT CONTRAST  TECHNIQUE: Contiguous axial images were obtained from the base of the skull through the vertex without intravenous contrast.  COMPARISON:  CT of the head December 16, 2012  FINDINGS: Right frontotemporal parietal encephalomalacia again noted with ex vacuo dilatation of the right lateral ventricle, right temporal horn. No hydrocephalus. Patchy supratentorial white matter hypodensities exclusive of the encephalomalacia may reflect chronic small vessel ischemic disease. No acute intra  cranial hemorrhage, mass effect, midline shift nor large vascular territory infarct.  No abnormal extra-axial fluid collections. Moderate calcific atherosclerosis of the carotid siphons. Poor dentition addition. Chronic paranasal sinusitis with near complete opacification left maxillary sinus. Periosteal Re absorption of the left maxilla partially imaged. Ocular globes and orbital contents are unremarkable though not tailored for evaluation. Left facial cutaneous nodule measures 10 mm, and though could reflect a sebaceous cyst, recommend direct inspection.  IMPRESSION: No acute intracranial process.  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 Moderate white matter changes suggest chronic small vessel ischemic disease.  Chronic paranasal sinusitis, with periosteal reabsorption of the left mandible, incompletely characterized.   Electronically Signed   By: Awilda Metroourtnay  Bloomer   On: 04/09/2013 04:16   Dg Chest Port 1 View  04/09/2013   CLINICAL DATA:  Shortness of breath and seizure.  COPD.  EXAM: PORTABLE CHEST - 1 VIEW  COMPARISON:  Chest radiograph Sep 01, 2011  FINDINGS: Cardiac silhouette is unremarkable and unchanged. Tortuous and possibly ectatic aorta is similar. Mild fullness of the pulmonary hilar are unchanged, and may reflect pulmonary arterial hypertension. Mild chronic interstitial changes within increased lung volumes. Minimal strandy densities in the lung bases. No pleural effusions or focal consolidations. No pneumothorax.  Tiny calcific densities project over right scapula and could reflect phleboliths, less likely loose bodies. Stable sclerotic nonspecific lesion in left humeral metadiaphysis. Multiple EKG lines overlie  the patient and may obscure subtle underlying pathology.  IMPRESSION: Stable cardiomegaly and COPD with bibasilar atelectasis.   Electronically Signed   By: Awilda Metro   On: 04/09/2013 01:16    Lab Results: Basic Metabolic Panel:  Recent Labs   04/12/13 0438  NA 140  K 4.4  CL 104  CO2 26  GLUCOSE 120*  BUN 16  CREATININE 0.83  CALCIUM 7.9*   Liver Function Tests: No results found for this basename: AST, ALT, ALKPHOS, BILITOT, PROT, ALBUMIN,  in the last 72 hours   CBC:  Recent Labs  04/12/13 0438  WBC 5.5  HGB 13.0  HCT 39.8  MCV 93.2  PLT 200    No results found for this or any previous visit (from the past 240 hour(s)).   Hospital Course:   Patient was admitted due to seizure and and alcohol withdrawal syndrome. He was loaded with keppra.. Patient was also evaluated by neurologist and EEG done. Patient remained deconditioned.  He is being transferred to nursing home.  Discharge Exam: Blood pressure 162/97, pulse 73, temperature 97.8 F (36.6 C), temperature source Oral, resp. rate 18, height 5\' 10"  (1.778 m), weight 61.689 kg (136 lb), SpO2 97.00%.   Disposition:  nursuing home      Signed: Allaina Brotzman   04/14/2013, 8:15 AM

## 2013-07-27 IMAGING — CR DG CHEST 1V
1 series · 1 of 1 positions shown · non-contrast
Comparison: 02/02/2008.

CLINICAL DATA: Dyspnea.

CHEST - 1 VIEW

[view not recorded]
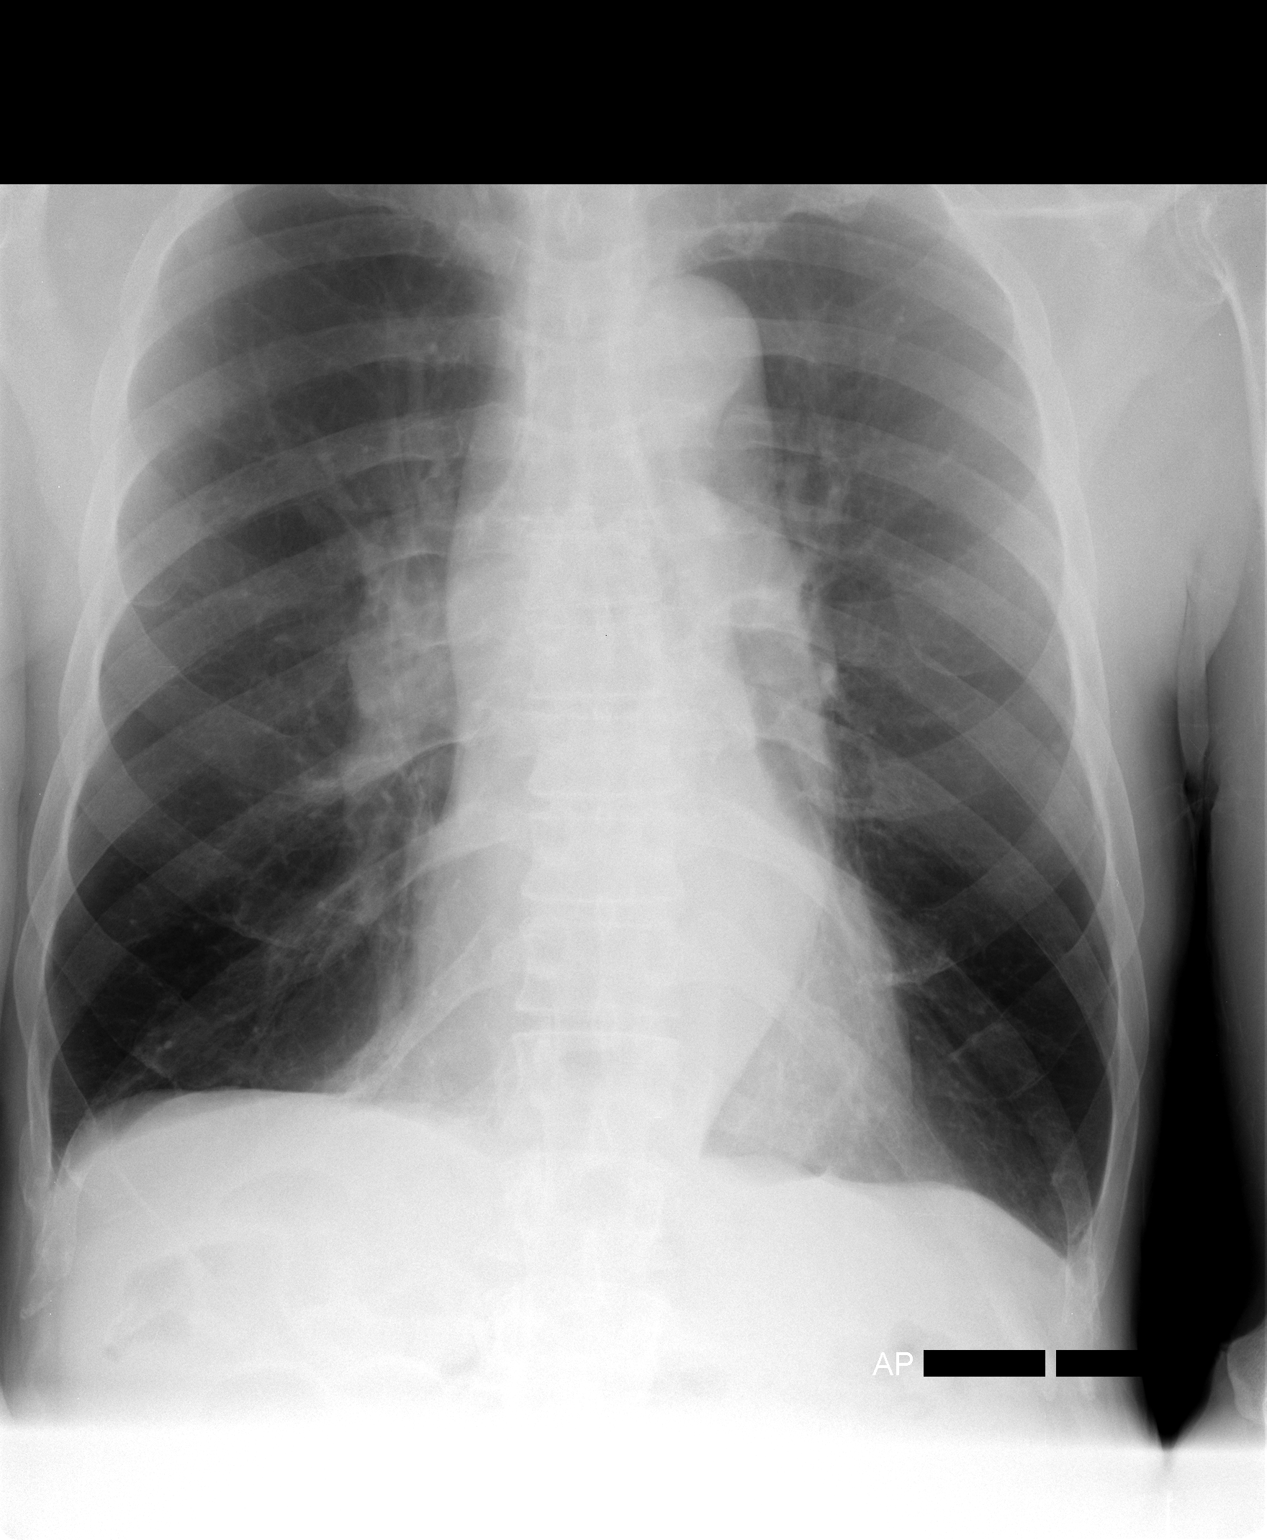

[1 of 1 positions shown; findings below may reference images not displayed]

FINDINGS: The heart remains normal in size.  The lungs remain clear
and hyperexpanded.  Tortuous aorta.  Mild scoliosis.
IMPRESSION: Stable changes of COPD.  No acute abnormality.

## 2013-07-27 IMAGING — CT CT HEAD W/O CM
1 of 2 series · 15 of 30 positions shown, 19 images · non-contrast
Comparison: CT 02/07/2005

CLINICAL DATA: Syncope last week.  Unsteady gait

CT HEAD WITHOUT CONTRAST
TECHNIQUE: Contiguous axial images were obtained from the base of
the skull through the vertex without contrast.

[Series 2: headseq 4.8 h37s · axial · 0.43mm/px · z∈[+61,+221]mm · 15 of 36 slices shown, 19 images]
[im 2/36  brain]
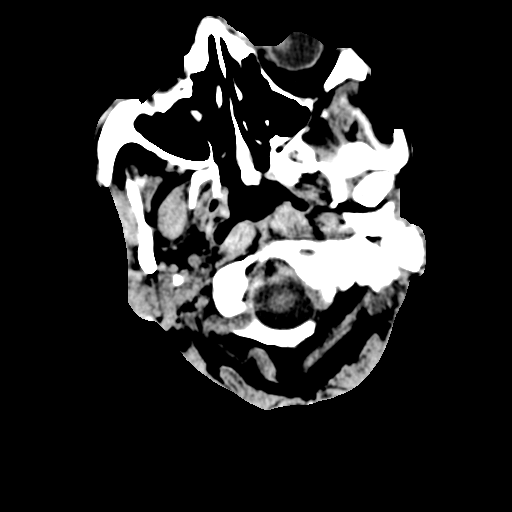
[im 2/36  bone]
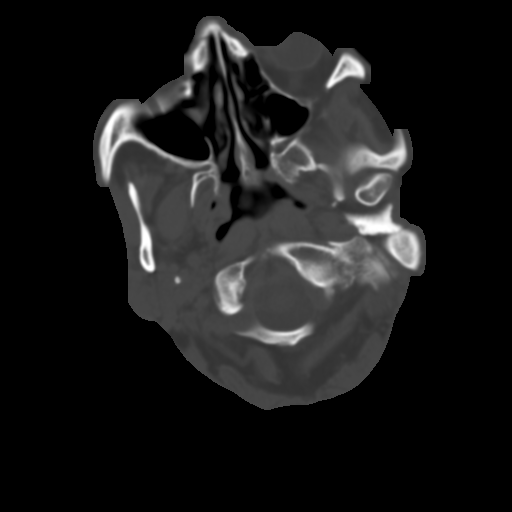
[im 6/36  brain]
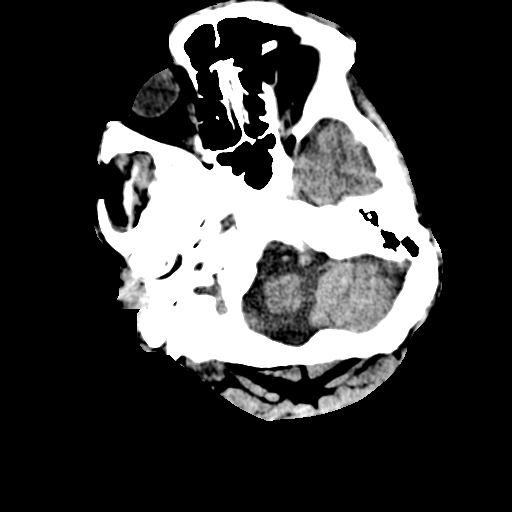
[im 7/36  brain]
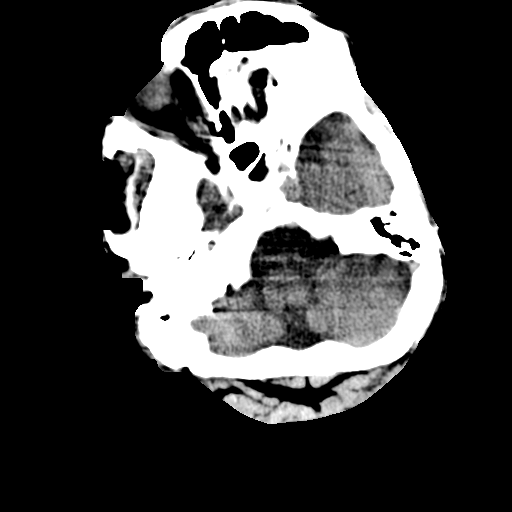
[im 9/36  brain]
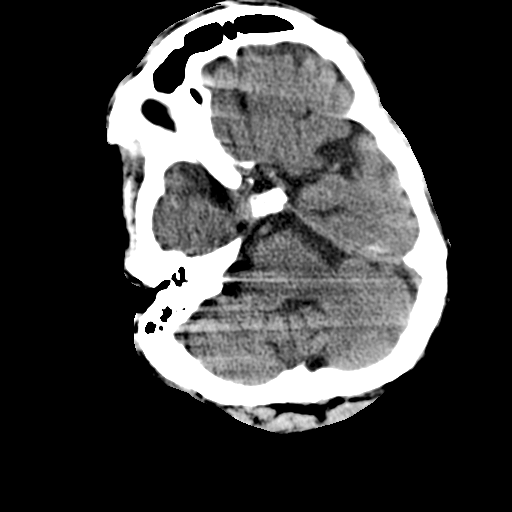
[im 12/36  brain]
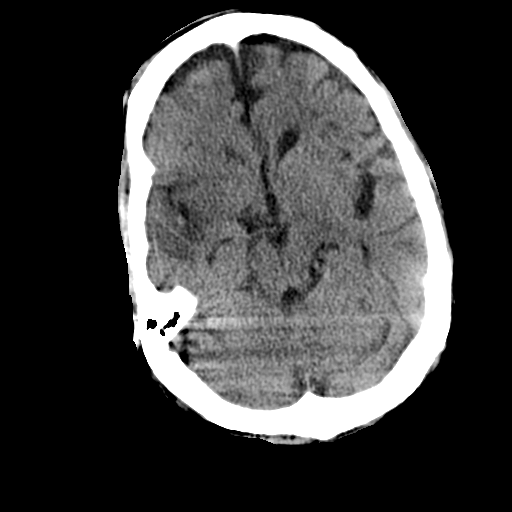
[im 12/36  bone]
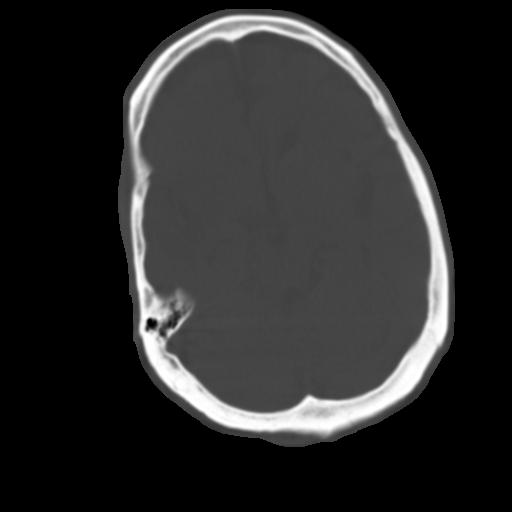
[im 14/36  brain]
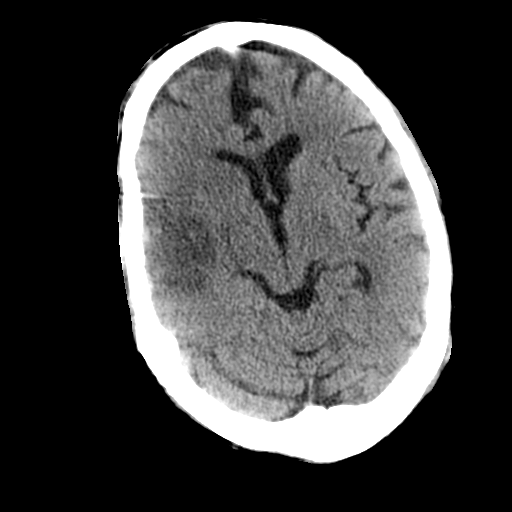
[im 16/36  brain]
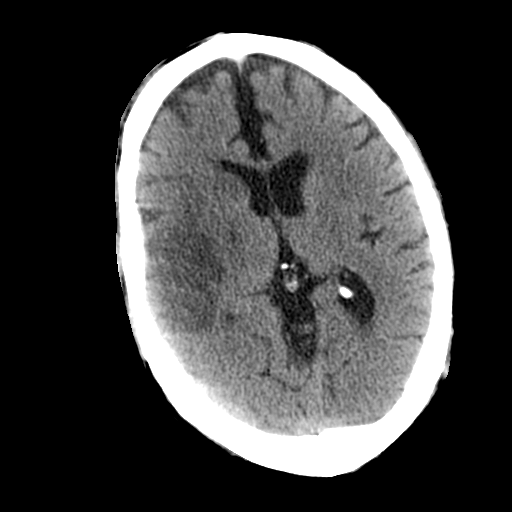
[im 19/36  brain]
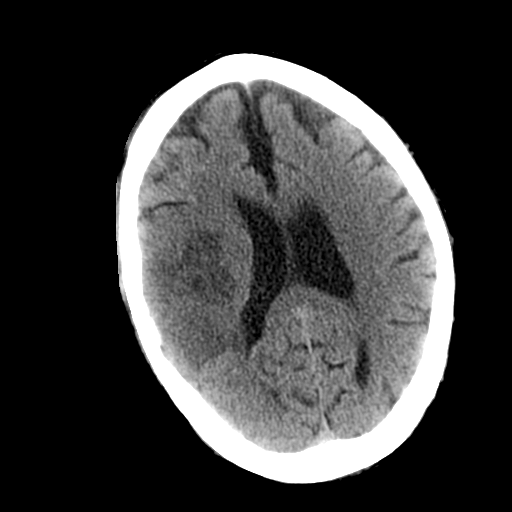
[im 21/36  brain]
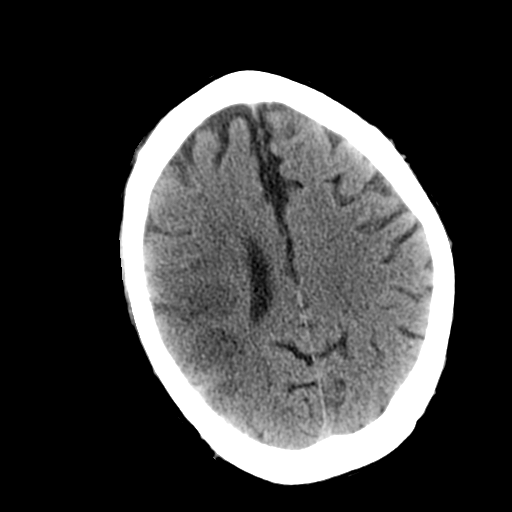
[im 21/36  bone]
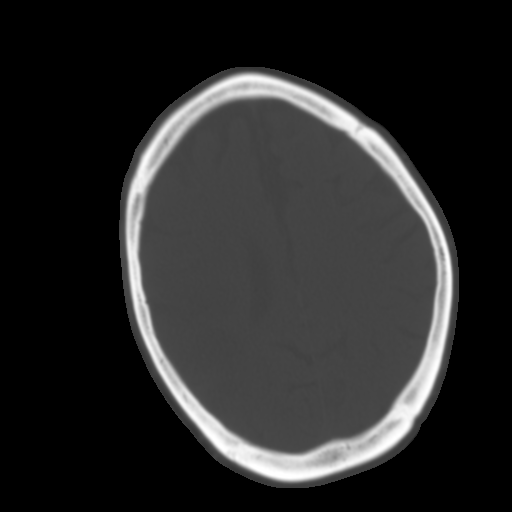
[im 22/36  brain]
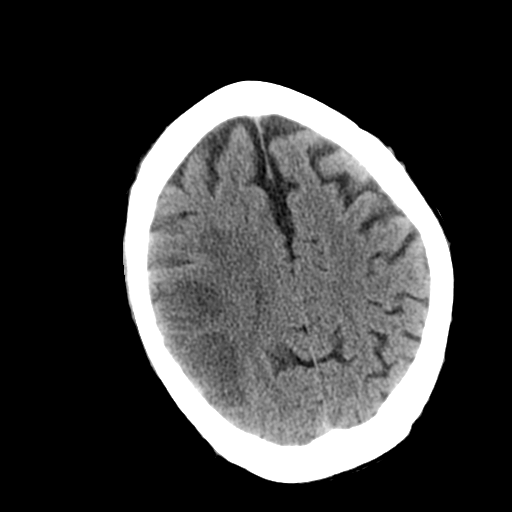
[im 26/36  brain]
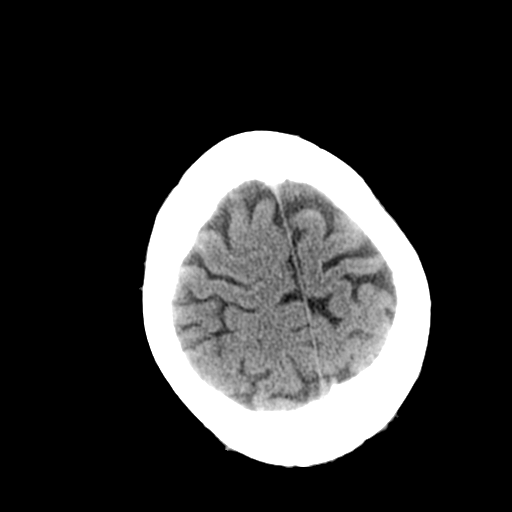
[im 27/36  brain]
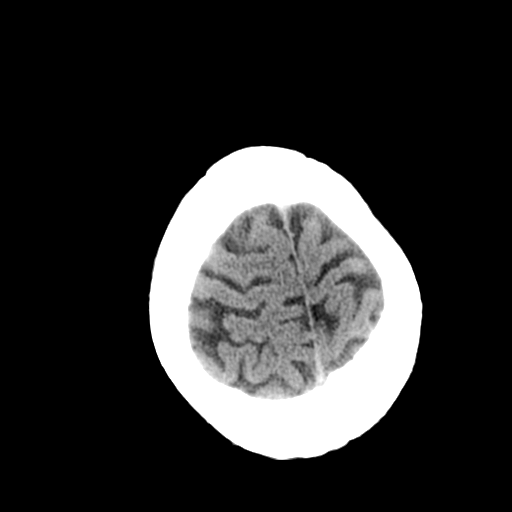
[im 29/36  brain]
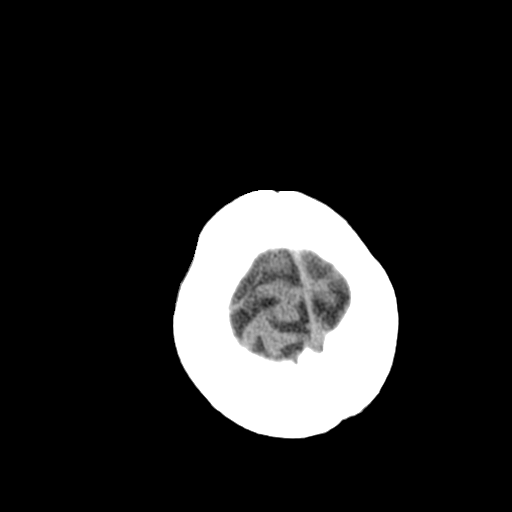
[im 29/36  bone]
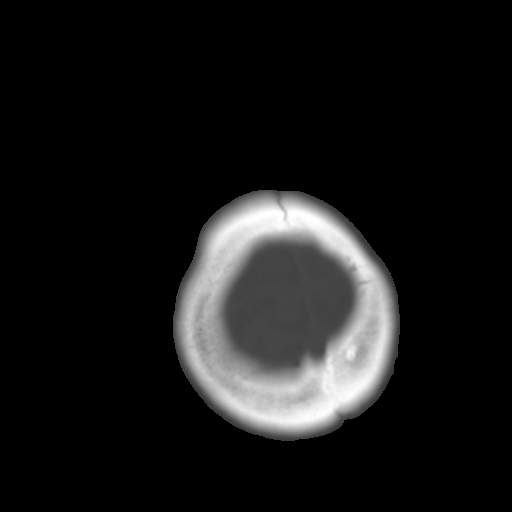
[im 32/36  brain]
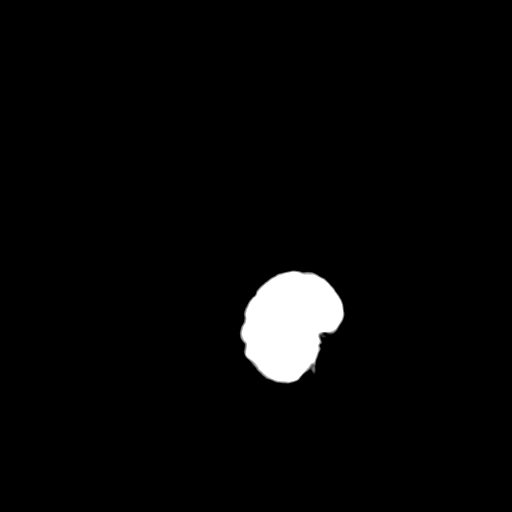
[im 34/36  brain]
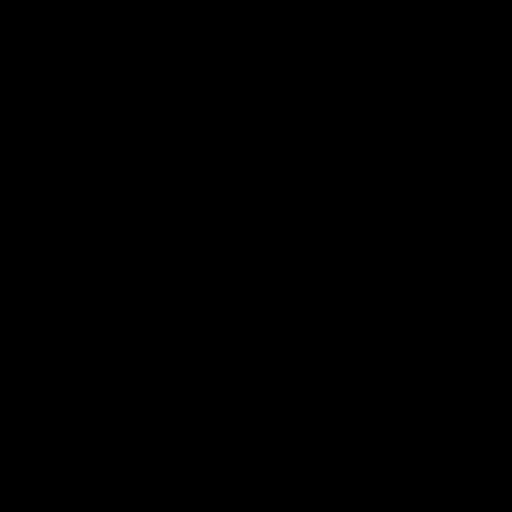

[15 of 30 positions shown; findings below may reference images not displayed]

FINDINGS: Hypodensity in the right insular cortex and right
parietal lobe compatible with acute infarct.  There may be mild
involvement of the right basal ganglia as well.  This infarct
involves approximately one third of the right MCA territory.
Negative for hemorrhage.  No midline shift.

Generalized atrophy of a moderate degree.  No hemorrhage or mass
lesion.

Increased density in the distal right middle cerebral artery in the
sylvian fissure may represent thrombosis.
IMPRESSION: Acute infarct right MCA territory without hemorrhage.

Critical Value/emergent results were called by telephone at the
time of interpretation on 08/24/2011  at 2521 hours  to  Dr. Parrales,
who verbally acknowledged these results.

## 2015-07-21 ENCOUNTER — Encounter: Payer: Self-pay | Admitting: Vascular Surgery

## 2015-07-29 ENCOUNTER — Ambulatory Visit (INDEPENDENT_AMBULATORY_CARE_PROVIDER_SITE_OTHER): Payer: Medicare Other | Admitting: Vascular Surgery

## 2015-07-29 ENCOUNTER — Encounter: Payer: Self-pay | Admitting: Vascular Surgery

## 2015-07-29 ENCOUNTER — Other Ambulatory Visit: Payer: Self-pay

## 2015-07-29 VITALS — BP 107/73 | HR 57 | Ht 70.0 in | Wt 128.0 lb

## 2015-07-29 DIAGNOSIS — I739 Peripheral vascular disease, unspecified: Secondary | ICD-10-CM | POA: Diagnosis not present

## 2015-07-29 NOTE — Progress Notes (Signed)
Referring Physician: Avon GullyFanta, Tesfaye, MD  Patient name: Jesus Lin H Hassey MRN: [id number] DOB: 09/13/1945 Sex: male  REASON FOR CONSULT: Leg ulcers with suspected peripheral arterial disease  HPI: Jesus Lin H Musial is a 70 y.o. male referred for nonhealing ulcerations both lower extremities left worse than right. The patient is wheelchair bound from a prior stroke and has a paralyzed left upper extremity and weak left lower extremity but he does use these for transfer. He states that he developed severe itching in both lower extremities and then developed ulcers after this. He has been nonambulatory since his stroke 2 years ago. He denies rest pain in the foot. Other medical problems include COPD, hypertension, past history of alcohol abuse and seizures, currently smoking 1 pack cigarettes per day.  He is currently on prednisone but I'm not sure why and neither is he.  Past Medical History  Diagnosis Date  . COPD (chronic obstructive pulmonary disease) (HCC)   . Stroke (HCC)   . Hypertension   . Gout   . Alcohol abuse   . Seizures (HCC)    Past surgical history: None  Family history: Denies history of early vascular disease SOCIAL HISTORY: Social History   Social History  . Marital Status: Divorced    Spouse Name: N/A  . Number of Children: N/A  . Years of Education: N/A   Occupational History  . Not on file.   Social History Main Topics  . Smoking status: Current Every Day Smoker -- 1.00 packs/day    Types: Cigarettes  . Smokeless tobacco: Not on file  . Alcohol Use: 0.0 oz/week    0 Standard drinks or equivalent per week     Comment: liquor daily  . Drug Use: No  . Sexual Activity: Not on file   Other Topics Concern  . Not on file   Social History Narrative    No Known Allergies  Current Outpatient Prescriptions  Medication Sig Dispense Refill  . amLODipine (NORVASC) 2.5 MG tablet Take 1 tablet (2.5 mg total) by mouth daily. 30 tablet 0  . aspirin 325 MG  tablet Take 1 tablet (325 mg total) by mouth daily. 30 tablet 3  . labetalol (NORMODYNE,TRANDATE) 5 MG/ML injection Inject 2 mLs (10 mg total) into the vein every 2 (two) hours as needed. 20 mL 30  . levETIRAcetam (KEPPRA) 500 MG tablet Take 1 tablet (500 mg total) by mouth 2 (two) times daily. 60 tablet 0  . predniSONE (STERAPRED UNI-PAK) 10 MG tablet Take by mouth daily. 20 tablet 0  . thiamine (VITAMIN B-1) 100 MG tablet Take 1 tablet (100 mg total) by mouth daily. 30 tablet 3   No current facility-administered medications for this visit.    ROS:   General:  No weight loss, Fever, chills  HEENT: No recent headaches, no nasal bleeding, no visual changes, no sore throat  Neurologic: No dizziness, blackouts, seizures. No recent symptoms of stroke or mini- stroke. No recent episodes of slurred speech, or temporary blindness.  Cardiac: No recent episodes of chest pain/pressure, no shortness of breath at rest.  + shortness of breath with exertion.  Denies history of atrial fibrillation or irregular heartbeat  Vascular: No history of rest pain in feet.  No history of claudication.  + history of non-healing ulcer, No history of DVT   Pulmonary: No home oxygen, no productive cough, no hemoptysis,  No asthma or wheezing  Musculoskeletal:  [ ]  Arthritis, [ ]  Low back pain,  [ ]  Joint  pain  Hematologic:No history of hypercoagulable state.  No history of easy bleeding.  No history of anemia  Gastrointestinal: No hematochezia or melena,  No gastroesophageal reflux, no trouble swallowing  Urinary:  chronic Kidney disease,  on HD -  MWF or  TTHS,  Burning with urination,  Frequent urination,  Difficulty urinating;   Skin: No rashes  Psychological: No history of anxiety,  No history of depression   Physical Examination  Filed Vitals:   07/29/15 1133  BP: 107/73  Pulse: 57  Height:  (1.778 m)  Weight: 128 lb (58.06 kg)    Body mass index is 18.37  kg/(m^2).  General:  Alert and oriented, no acute distress HEENT: Normal Neck: No bruit or JVD Pulmonary: Clear to auscultation bilaterally Cardiac: Regular Rate and Rhythm without murmur Abdomen: Soft, non-tender, non-distended, no mass Skin: No rash, multiple scattered ulcers 2-3 cm diameter diffusely from the knee down in the left leg with some blisters on the dorsal foot left foot is cool similar findings on the right although not as extensive Extremity Pulses:  2+ radial, brachial, difficult to palpate femoral due to the fact the patient is in a wheelchair, absent dorsalis pedis, posterior tibial pulses bilaterally Musculoskeletal: Left upper extremity flexed and contracted   Neurologic: Unable to move left upper extremity, left lower extremity 3/5 motor strength  DATA:  Patient had a noninvasive arterial duplex scan performed on 06/30/2015. I reviewed this today. ABI could not be calculated due to calcification. There was a suggestion of infrapopliteal arterial occlusive disease but the patient did have 2 vessel runoff via the posterior tibial and dorsalis pedis artery with blunting at the level of the ankle suggesting distal occlusion  ASSESSMENT:  Patient with extensive wounds on his left lower extremity at risk for limb loss. The patient is a poor operative candidate overall due to his prior stroke and nonambulatory status. Also the foot itself is fairly extensively affected by these ulcers and may not be salvageable.   PLAN:  Aortogram with bilateral lower extremity runoff possible intervention tomorrow. I discussed the patient risks benefits possible complications and procedure details including not limited to bleeding infection vessel injury contrast reaction. He understands and agrees to proceed. If we are unable to come up with a percutaneous solution he may be at very high risk for limb loss and require a left above-knee amputation at some point in the future. All this was discussed  with the patient today.   Fabienne Bruns, MD [facility] [place] Office: [phone] Pager: [phone]

## 2015-07-30 ENCOUNTER — Encounter: Payer: Self-pay | Admitting: Internal Medicine

## 2015-07-30 ENCOUNTER — Ambulatory Visit (HOSPITAL_COMMUNITY)
Admission: RE | Admit: 2015-07-30 | Discharge: 2015-07-30 | Disposition: A | Discharge status: Home / Self Care | Payer: Medicare Other | Source: Ambulatory Visit | Attending: Vascular Surgery | Admitting: Vascular Surgery

## 2015-07-30 ENCOUNTER — Encounter (HOSPITAL_COMMUNITY)
Admission: RE | Disposition: A | Discharge status: Home / Self Care | Payer: Self-pay | Source: Ambulatory Visit | Attending: Vascular Surgery

## 2015-07-30 DIAGNOSIS — I739 Peripheral vascular disease, unspecified: Secondary | ICD-10-CM | POA: Diagnosis present

## 2015-07-30 DIAGNOSIS — R569 Unspecified convulsions: Secondary | ICD-10-CM | POA: Insufficient documentation

## 2015-07-30 DIAGNOSIS — F17210 Nicotine dependence, cigarettes, uncomplicated: Secondary | ICD-10-CM | POA: Diagnosis not present

## 2015-07-30 DIAGNOSIS — I70242 Atherosclerosis of native arteries of left leg with ulceration of calf: Secondary | ICD-10-CM

## 2015-07-30 DIAGNOSIS — I10 Essential (primary) hypertension: Secondary | ICD-10-CM | POA: Diagnosis not present

## 2015-07-30 DIAGNOSIS — Z7982 Long term (current) use of aspirin: Secondary | ICD-10-CM | POA: Diagnosis not present

## 2015-07-30 DIAGNOSIS — M109 Gout, unspecified: Secondary | ICD-10-CM | POA: Insufficient documentation

## 2015-07-30 DIAGNOSIS — F1010 Alcohol abuse, uncomplicated: Secondary | ICD-10-CM | POA: Diagnosis not present

## 2015-07-30 DIAGNOSIS — L97829 Non-pressure chronic ulcer of other part of left lower leg with unspecified severity: Secondary | ICD-10-CM | POA: Insufficient documentation

## 2015-07-30 DIAGNOSIS — I70248 Atherosclerosis of native arteries of left leg with ulceration of other part of lower left leg: Secondary | ICD-10-CM | POA: Insufficient documentation

## 2015-07-30 DIAGNOSIS — J449 Chronic obstructive pulmonary disease, unspecified: Secondary | ICD-10-CM | POA: Diagnosis not present

## 2015-07-30 DIAGNOSIS — Z993 Dependence on wheelchair: Secondary | ICD-10-CM | POA: Insufficient documentation

## 2015-07-30 DIAGNOSIS — I69354 Hemiplegia and hemiparesis following cerebral infarction affecting left non-dominant side: Secondary | ICD-10-CM | POA: Insufficient documentation

## 2015-07-30 DIAGNOSIS — I70238 Atherosclerosis of native arteries of right leg with ulceration of other part of lower right leg: Secondary | ICD-10-CM | POA: Diagnosis not present

## 2015-07-30 DIAGNOSIS — L97819 Non-pressure chronic ulcer of other part of right lower leg with unspecified severity: Secondary | ICD-10-CM | POA: Insufficient documentation

## 2015-07-30 HISTORY — PX: PERIPHERAL VASCULAR CATHETERIZATION: SHX172C

## 2015-07-30 LAB — POCT I-STAT, CHEM 8
BUN: 21 mg/dL — ABNORMAL HIGH (ref 6–20)
CHLORIDE: 108 mmol/L (ref 101–111)
CREATININE: 1.2 mg/dL (ref 0.61–1.24)
Calcium, Ion: 1.06 mmol/L — ABNORMAL LOW (ref 1.13–1.30)
GLUCOSE: 85 mg/dL (ref 65–99)
HCT: 40 % (ref 39.0–52.0)
Hemoglobin: 13.6 g/dL (ref 13.0–17.0)
POTASSIUM: 4.5 mmol/L (ref 3.5–5.1)
Sodium: 145 mmol/L (ref 135–145)
TCO2: 26 mmol/L (ref 0–100)

## 2015-07-30 SURGERY — ABDOMINAL AORTOGRAM W/LOWER EXTREMITY
Anesthesia: LOCAL

## 2015-07-30 MED ORDER — ACETAMINOPHEN 325 MG PO TABS: 325.0000 mg | ORAL_TABLET | ORAL | Status: DC | PRN

## 2015-07-30 MED ORDER — LIDOCAINE HCL (PF) 1 % IJ SOLN
INTRAMUSCULAR | Status: DC | PRN
  Administered 2015-07-30: 10 mL via SUBCUTANEOUS

## 2015-07-30 MED ORDER — HYDRALAZINE HCL 20 MG/ML IJ SOLN: 5.0000 mg | INTRAMUSCULAR | Status: DC | PRN

## 2015-07-30 MED ORDER — HEPARIN (PORCINE) IN NACL 2-0.9 UNIT/ML-% IJ SOLN
INTRAMUSCULAR | Status: AC
  Filled 2015-07-30: qty 1000

## 2015-07-30 MED ORDER — SODIUM CHLORIDE 0.45 % IV SOLN
INTRAVENOUS | Status: DC
  Administered 2015-07-30: 11:00:00 via INTRAVENOUS

## 2015-07-30 MED ORDER — ONDANSETRON HCL 4 MG/2ML IJ SOLN: 4.0000 mg | Freq: Four times a day (QID) | INTRAMUSCULAR | Status: DC | PRN

## 2015-07-30 MED ORDER — MORPHINE SULFATE (PF) 10 MG/ML IV SOLN: 2.0000 mg | INTRAVENOUS | Status: DC | PRN

## 2015-07-30 MED ORDER — ACETAMINOPHEN 325 MG RE SUPP: 325.0000 mg | RECTAL | Status: DC | PRN

## 2015-07-30 MED ORDER — LIDOCAINE HCL (PF) 1 % IJ SOLN
INTRAMUSCULAR | Status: AC
  Filled 2015-07-30: qty 30

## 2015-07-30 MED ORDER — SODIUM CHLORIDE 0.9 % IV SOLN
INTRAVENOUS | Status: DC
  Administered 2015-07-30: 08:00:00 via INTRAVENOUS

## 2015-07-30 MED ORDER — ALUM & MAG HYDROXIDE-SIMETH 200-200-20 MG/5ML PO SUSP: 15.0000 mL | ORAL | Status: DC | PRN

## 2015-07-30 MED ORDER — SODIUM CHLORIDE 0.9 % IV SOLN: 500.0000 mL | Freq: Once | INTRAVENOUS | Status: DC | PRN

## 2015-07-30 MED ORDER — METOPROLOL TARTRATE 5 MG/5ML IV SOLN: 2.0000 mg | INTRAVENOUS | Status: DC | PRN

## 2015-07-30 MED ORDER — HEPARIN SODIUM (PORCINE) 1000 UNIT/ML IJ SOLN
INTRAMUSCULAR | Status: DC | PRN
  Administered 2015-07-30: 1000 [IU] via INTRAVENOUS

## 2015-07-30 MED ORDER — LABETALOL HCL 5 MG/ML IV SOLN: 10.0000 mg | INTRAVENOUS | Status: DC | PRN

## 2015-07-30 SURGICAL SUPPLY — 10 items
CATH ANGIO 5F PIGTAIL 65CM (CATHETERS) ×1 IMPLANT
CATH CROSS OVER TEMPO 5F (CATHETERS) ×1 IMPLANT
CATH STRAIGHT 5FR 65CM (CATHETERS) ×1 IMPLANT
GUIDEWIRE ANGLED .035X150CM (WIRE) ×2 IMPLANT
KIT PV (KITS) ×2 IMPLANT
SHEATH PINNACLE 5F 10CM (SHEATH) ×1 IMPLANT
SYR MEDRAD MARK V 150ML (SYRINGE) ×2 IMPLANT
TRANSDUCER W/STOPCOCK (MISCELLANEOUS) ×2 IMPLANT
TRAY PV CATH (CUSTOM PROCEDURE TRAY) ×2 IMPLANT
WIRE HITORQ VERSACORE ST 145CM (WIRE) ×1 IMPLANT

## 2015-07-30 NOTE — Progress Notes (Signed)
Site area: rt groin Site Prior to Removal:  Level 0 Pressure Applied For:  20 minutes Manual:   yes Patient Status During Pull:  stable Post Pull Site:  Level  0 Post Pull Instructions Given:  yes Post Pull Pulses Present: yes Dressing Applied:  tegaderm Bedrest begins @  1045 Comments:   

## 2015-07-30 NOTE — Interval H&P Note (Signed)
History and Physical Interval Note:  07/30/2015 7:34 AM  Jesus Lin  has presented today for surgery, with the diagnosis of PVD with Left Leg Ulcer  The various methods of treatment have been discussed with the patient and family. After consideration of risks, benefits and other options for treatment, the patient has consented to  Procedure(s): Abdominal Aortogram w/ Bilateral Lower Extremity Runoff (N/A) as a surgical intervention .  The patient's history has been reviewed, patient examined, no change in status, stable for surgery.  I have reviewed the patient's chart and labs.  Questions were answered to the patient's satisfaction.     Fabienne BrunsFields, Charles

## 2015-07-30 NOTE — Progress Notes (Signed)
Temp reported to Janne NapoleonJennifer O'Neal, cath lab RN who will inform Dr. Darrick PennaFields.

## 2015-07-30 NOTE — Op Note (Signed)
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 The patient was placed in supine position the Angio table. Both groins were prepped and draped in usual sterile fashion. Local anesthesia was treated of the right common femoral artery. An introducer needle was used to cannulate the right common femoral artery under ultrasound guidance and 035 versacore wire was inserted and the abdominal aorta under fluoroscopic guidance. A 5 French sheath was placed over the guidewire in the right common femoral artery. This was thoroughly flushed with heparinized saline. A 5 French pigtail catheter was then placed over the guidewire and advanced up to the level of the  Proximal abdominal aorta. Abdominal aortogram was then obtained in AP projection. Films were suboptimal due to patient being able to follow commands to hold breathing and significant motion artifact. However, the infrarenal abdominal aorta is patent as well as the left and right common external and internal iliac arteries. At this point the apical catheter was removed over a guidewire and a 5 [place] crossover catheter brought up in the operative field and used to select would catheterize the left common iliac artery. The left iliac system was fairly tortuous. I tried to advance a versacore wire crosses initially but it would not pass. Therefore an 83 5 angled Glidewire, the operative field and I was able to advance this into the  Distal left internal iliac artery to get the wire some purchase. I then pulled the 5 [place] crossover catheter out and exchanges for a 5 French straight catheter and with this engaged in the left common iliac artery were performed left  lower extremity runoff views.  In the left lower extremity, the left common femoral profunda femoris and superficial femoral arteries are all widely patent. The popliteal artery is patent. The anterior and posterior tibial arteries are completely obliterated. There is a focal stenosis at the origin of the peroneal artery but otherwise the vessel is intact although severely diffusely diseased and small throughout its course. At this point the 5 French straight catheter was removed and a right lower extremity arteriogram performed through the sheath. In the right lower extremity, the right common femoral profunda femoris and superficial femoral arteries are all patent. The popliteal artery is patent. The anterior and posterior tibial arteries are occluded. The proximal peroneal artery is occluded. It does reconstitute after several centimeters and does give off a collateral branch which eventually fills the dorsalis pedis artery.  At this point a 5 [place] sheath was thoroughly flushed. The patient was taken to the holding area in stable condition.  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 This leg as previously been diminished function prior stroke. He is not an operative candidate for the left leg. The only option left leg with the patient in the future if the wounds do not heal spontaneously. This would be an above-knee amputation.  As far as the right leg is concerned, the patient also has unreconstructable tibial artery  Disease and is not a candidate for operation on this lady is overall debility. If the wounds do not heal spontaneously or with local wound care in the right leg he also would require amputation on this side. The patient will follow-up with me on as-needed basis if  he has unrelenting pain from his legs or feet from his arterial occlusive disease and wishes to have above-knee amputation. Otherwise he can [facility]  [facility]  Charles Fields, MD [facility] [place] Office: [phone] Pager: [phone]

## 2015-07-30 NOTE — H&P (View-Only) (Signed)
  Referring Physician: Fanta, Tesfaye, MD  Patient name: Jesus Lin MRN: [id number] DOB: 01/12/1946 Sex: male  REASON FOR CONSULT: Leg ulcers with suspected peripheral arterial disease  HPI: Jesus Lin is a 69 y.o. male referred for nonhealing ulcerations both lower extremities left worse than right. The patient is wheelchair bound from a prior stroke and has a paralyzed left upper extremity and weak left lower extremity but he does use these for transfer. He states that he developed severe itching in both lower extremities and then developed ulcers after this. He has been nonambulatory since his stroke 2 years ago. He denies rest pain in the foot. Other medical problems include COPD, hypertension, past history of alcohol abuse and seizures, currently smoking 1 pack cigarettes per day.  He is currently on prednisone but I'm not sure why and neither is he.  Past Medical History  Diagnosis Date  . COPD (chronic obstructive pulmonary disease) (HCC)   . Stroke (HCC)   . Hypertension   . Gout   . Alcohol abuse   . Seizures (HCC)    Past surgical history: None  Family history: Denies history of early vascular disease SOCIAL HISTORY: Social History   Social History  . Marital Status: Divorced    Spouse Name: N/A  . Number of Children: N/A  . Years of Education: N/A   Occupational History  . Not on file.   Social History Main Topics  . Smoking status: Current Every Day Smoker -- 1.00 packs/day    Types: Cigarettes  . Smokeless tobacco: Not on file  . Alcohol Use: 0.0 oz/week    0 Standard drinks or equivalent per week     Comment: liquor daily  . Drug Use: No  . Sexual Activity: Not on file   Other Topics Concern  . Not on file   Social History Narrative    No Known Allergies  Current Outpatient Prescriptions  Medication Sig Dispense Refill  . amLODipine (NORVASC) 2.5 MG tablet Take 1 tablet (2.5 mg total) by mouth daily. 30 tablet 0  . aspirin 325 MG  tablet Take 1 tablet (325 mg total) by mouth daily. 30 tablet 3  . labetalol (NORMODYNE,TRANDATE) 5 MG/ML injection Inject 2 mLs (10 mg total) into the vein every 2 (two) hours as needed. 20 mL 30  . levETIRAcetam (KEPPRA) 500 MG tablet Take 1 tablet (500 mg total) by mouth 2 (two) times daily. 60 tablet 0  . predniSONE (STERAPRED UNI-PAK) 10 MG tablet Take by mouth daily. 20 tablet 0  . thiamine (VITAMIN B-1) 100 MG tablet Take 1 tablet (100 mg total) by mouth daily. 30 tablet 3   No current facility-administered medications for this visit.    ROS:   General:  No weight loss, Fever, chills  HEENT: No recent headaches, no nasal bleeding, no visual changes, no sore throat  Neurologic: No dizziness, blackouts, seizures. No recent symptoms of stroke or mini- stroke. No recent episodes of slurred speech, or temporary blindness.  Cardiac: No recent episodes of chest pain/pressure, no shortness of breath at rest.  + shortness of breath with exertion.  Denies history of atrial fibrillation or irregular heartbeat  Vascular: No history of rest pain in feet.  No history of claudication.  + history of non-healing ulcer, No history of DVT   Pulmonary: No home oxygen, no productive cough, no hemoptysis,  No asthma or wheezing  Musculoskeletal:  [ ] Arthritis, [ ] Low back pain,  [ ] Joint   pain  Hematologic:No history of hypercoagulable state.  No history of easy bleeding.  No history of anemia  Gastrointestinal: No hematochezia or melena,  No gastroesophageal reflux, no trouble swallowing  Urinary: [ ] chronic Kidney disease, [ ] on HD - [ ] MWF or [ ] TTHS, [ ] Burning with urination, [ ] Frequent urination, [ ] Difficulty urinating;   Skin: No rashes  Psychological: No history of anxiety,  No history of depression   Physical Examination  Filed Vitals:   07/29/15 1133  BP: 107/73  Pulse: 57  Height: 5' 10" (1.778 m)  Weight: 128 lb (58.06 kg)    Body mass index is 18.37  kg/(m^2).  General:  Alert and oriented, no acute distress HEENT: Normal Neck: No bruit or JVD Pulmonary: Clear to auscultation bilaterally Cardiac: Regular Rate and Rhythm without murmur Abdomen: Soft, non-tender, non-distended, no mass Skin: No rash, multiple scattered ulcers 2-3 cm diameter diffusely from the knee down in the left leg with some blisters on the dorsal foot left foot is cool similar findings on the right although not as extensive Extremity Pulses:  2+ radial, brachial, difficult to palpate femoral due to the fact the patient is in a wheelchair, absent dorsalis pedis, posterior tibial pulses bilaterally Musculoskeletal: Left upper extremity flexed and contracted   Neurologic: Unable to move left upper extremity, left lower extremity 3/5 motor strength  DATA:  Patient had a noninvasive arterial duplex scan performed on 06/30/2015. I reviewed this today. ABI could not be calculated due to calcification. There was a suggestion of infrapopliteal arterial occlusive disease but the patient did have 2 vessel runoff via the posterior tibial and dorsalis pedis artery with blunting at the level of the ankle suggesting distal occlusion  ASSESSMENT:  Patient with extensive wounds on his left lower extremity at risk for limb loss. The patient is a poor operative candidate overall due to his prior stroke and nonambulatory status. Also the foot itself is fairly extensively affected by these ulcers and may not be salvageable.   PLAN:  Aortogram with bilateral lower extremity runoff possible intervention tomorrow. I discussed the patient risks benefits possible complications and procedure details including not limited to bleeding infection vessel injury contrast reaction. He understands and agrees to proceed. If we are unable to come up with a percutaneous solution he may be at very high risk for limb loss and require a left above-knee amputation at some point in the future. All this was discussed  with the patient today.   Charles Fields, MD [facility] [place] Office: [phone] Pager: [phone]    

## 2015-07-30 NOTE — Discharge Instructions (Signed)
Angiogram, Care After °Refer to this sheet in the next few weeks. These instructions provide you with information about caring for yourself after your procedure. Your health care provider may also give you more specific instructions. Your treatment has been planned according to current medical practices, but problems sometimes occur. Call your health care provider if you have any problems or questions after your procedure. °WHAT TO EXPECT AFTER THE PROCEDURE °After your procedure, it is typical to have the following: °· Bruising at the catheter insertion site that usually fades within 1-2 weeks. °· Blood collecting in the tissue (hematoma) that may be painful to the touch. It should usually decrease in size and tenderness within 1-2 weeks. °HOME CARE INSTRUCTIONS °· Take medicines only as directed by your health care provider. °· You may shower 24-48 hours after the procedure or as directed by your health care provider. Remove the bandage (dressing) and gently wash the site with plain soap and water. Pat the area dry with a clean towel. Do not rub the site, because this may cause bleeding. °· Do not take baths, swim, or use a hot tub until your health care provider approves. °· Check your insertion site every day for redness, swelling, or drainage. °· Do not apply powder or lotion to the site. °· Do not lift over 10 lb (4.5 kg) for 5 days after your procedure or as directed by your health care provider. °· Ask your health care provider when it is okay to: °¨ Return to work or school. °¨ Resume usual physical activities or sports. °¨ Resume sexual activity. °· Do not drive home if you are discharged the same day as the procedure. Have someone else drive you. °· You may drive 24 hours after the procedure unless otherwise instructed by your health care provider. °· Do not operate machinery or power tools for 24 hours after the procedure or as directed by your health care provider. °· If your procedure was done as an  outpatient procedure, which means that you went home the same day as your procedure, a responsible adult should be with you for the first 24 hours after you arrive home. °· Keep all follow-up visits as directed by your health care provider. This is important. °SEEK MEDICAL CARE IF: °· You have a fever. °· You have chills. °· You have increased bleeding from the catheter insertion site. Hold pressure on the site. °SEEK IMMEDIATE MEDICAL CARE IF: °· You have unusual pain at the catheter insertion site. °· You have redness, warmth, or swelling at the catheter insertion site. °· You have drainage (other than a small amount of blood on the dressing) from the catheter insertion site. °· The catheter insertion site is bleeding, and the bleeding does not stop after 30 minutes of holding steady pressure on the site. °· The area near or just beyond the catheter insertion site becomes pale, cool, tingly, or numb. °  °This information is not intended to replace advice given to you by your health care provider. Make sure you discuss any questions you have with your health care provider. °  °Document Released: 10/06/2004 Document Revised: 04/10/2014 Document Reviewed: 08/21/2012 °Elsevier Interactive Patient Education ©2016 [employer] ° °

## 2015-08-02 ENCOUNTER — Encounter (HOSPITAL_COMMUNITY): Payer: Self-pay | Admitting: Vascular Surgery

## 2015-08-02 MED FILL — Heparin Sodium (Porcine) 2 Unit/ML in Sodium Chloride 0.9%: INTRAMUSCULAR | Qty: 1000 | Status: AC

## 2015-08-05 ENCOUNTER — Telehealth: Payer: Self-pay

## 2015-08-05 NOTE — Telephone Encounter (Signed)
-----   Message from Sharee PimpleMarilyn K McChesney, RN sent at 07/30/2015  1:23 PM EDT ----- Regarding: Lorain ChildesFYI   ----- Message -----    From: Sherren Kernsharles E Fields, MD    Sent: 07/30/2015  10:10 AM      To: [facility] Charge Pool  Aortogram with bilat runoff US right groin First order cath left common iliac Unreconstructable tibial disease  Follow up PRN if he wishes above knee amputation  Fabienne Brunsharles Fields, MD [facility] [place] Office: [phone] Pager: [phone]

## 2018-08-20 ENCOUNTER — Ambulatory Visit (INDEPENDENT_AMBULATORY_CARE_PROVIDER_SITE_OTHER): Payer: Medicare Other | Admitting: Orthopedic Surgery

## 2018-08-20 ENCOUNTER — Ambulatory Visit (INDEPENDENT_AMBULATORY_CARE_PROVIDER_SITE_OTHER): Payer: Medicare Other

## 2018-08-20 ENCOUNTER — Encounter: Payer: Self-pay | Admitting: Orthopedic Surgery

## 2018-08-20 ENCOUNTER — Other Ambulatory Visit: Payer: Self-pay

## 2018-08-20 VITALS — Ht 70.0 in | Wt 140.0 lb

## 2018-08-20 DIAGNOSIS — I96 Gangrene, not elsewhere classified: Secondary | ICD-10-CM

## 2018-08-20 DIAGNOSIS — I739 Peripheral vascular disease, unspecified: Secondary | ICD-10-CM | POA: Diagnosis not present

## 2018-08-20 NOTE — Progress Notes (Signed)
Office Visit Note   Patient: Jesus Lin           Date of Birth: 24-Mar-1946           MRN: [id number] Visit Date: 08/20/2018              Requested by: Avon Gully, MD [address] [place], [place] [address] PCP: Avon Gully, MD  Chief Complaint  Patient presents with  . Right Foot - Pain    2nd toe pain      HPI: The patient is a 73 yo gentleman seen for gangrene of his right 2nd and 3rd toes. He reports he noticed the darkening of the toes a couple of weeks ago. 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 He resides at [facility]. He reports the foot is being washed daily and has not been draining or red or swollen.   He had work up of bilateral lower leg vasculature with aortogram which showed severe unreconstructable tibial artery occlusive disease. He apparently had ulcerations over the feet at that time, but has no open ulcerations over the right foot today.   Assessment & Plan: Visit Diagnoses:  1. Gangrene of toe of right foot (HCC)   2. PAD (peripheral artery disease) (HCC)     Plan: Appears to have dry gangrene and likely may auto amputate over time.  Will follow up in about 4 weeks unless starts to drain sooner.   Follow-Up Instructions: Return in about 4 weeks (around 09/17/2018).   Ortho Exam  Patient is alert, oriented, no adenopathy, well-dressed, normal affect, normal respiratory effort. Patient presents in wheelchair and has left hemiplegia from previous CVA.  Ischemic changes, dry gangrene over the 2nd, 3rd and 4th toes of the right foot noted.  Pulses right DP is biphasic and PT is biphasic. There is some mild swelling and tenderness with palpation but no drainage.     Imaging: Xr Foot 2 Views Right  Result Date: 08/20/2018 Right foot radiographs without clear osteomyelitis  No images are attached to the encounter.  Labs: Lab Results  Component Value Date   HGBA1C 5.6 08/25/2011   LABURIC 8.9 (H) 08/25/2011     Lab Results  Component Value Date   ALBUMIN 2.9 (L) 04/09/2013   ALBUMIN 3.1 (L) 12/16/2012   ALBUMIN 3.3 (L) 09/22/2012   LABURIC 8.9 (H) 08/25/2011    Body mass index is 20.09 kg/m.  Orders:  Orders Placed This Encounter  Procedures  . XR Foot 2 Views Right   No orders of the defined types were placed in this encounter.    Procedures: No procedures performed  Clinical Data: No additional findings.  ROS:  All other systems negative, except as noted in the HPI. Review of Systems  Objective: Vital Signs: Ht 5\' 10"  (1.778 m)   Wt 140 lb (63.5 kg)   BMI 20.09 kg/m   Specialty Comments:  No specialty comments available.  PMFS History: Patient Active Problem List   Diagnosis Date Noted  . Alcohol withdrawal seizure (HCC) 04/09/2013  . Other convulsions 12/16/2012  . Hemiplegia, unspecified, affecting nondominant side 12/16/2012  . Cocaine abuse (HCC) 12/16/2012  . Seizures (HCC)   . Tobacco abuse 08/26/2011  . Acute gout 08/25/2011  . Acute ischemic right MCA stroke (HCC) 08/24/2011  . Alcohol abuse 08/24/2011   Past Medical History:  Diagnosis Date  . Alcohol abuse   . COPD (chronic obstructive  pulmonary disease) (HCC)   . Gout   . Hypertension   . Seizures (HCC)   . Stroke [facility](HCC)     History reviewed. No pertinent family history.  Past Surgical History:  Procedure Laterality Date  . PERIPHERAL VASCULAR CATHETERIZATION N/A 07/30/2015   Procedure: Abdominal Aortogram w/ Bilateral Lower Extremity Runoff;  Surgeon: Sherren Kernsharles E Fields, MD;  [facility];  Service: Cardiovascular;  Laterality: N/A;   Social History   Occupational History  . Not on file  Tobacco Use  . Smoking status: Current Every Day Smoker    Packs/day: 1.00    Types: Cigarettes  Substance and Sexual Activity  . Alcohol use: Yes    Alcohol/week: 0.0 standard drinks    Comment: liquor daily  . Drug use: No   . Sexual activity: Not on file

## 2018-09-19 ENCOUNTER — Ambulatory Visit (INDEPENDENT_AMBULATORY_CARE_PROVIDER_SITE_OTHER): Payer: Medicare Other | Admitting: Physician Assistant

## 2018-09-19 ENCOUNTER — Encounter: Payer: Self-pay | Admitting: Orthopedic Surgery

## 2018-09-19 ENCOUNTER — Ambulatory Visit: Payer: Medicare Other | Admitting: Orthopedic Surgery

## 2018-09-19 ENCOUNTER — Other Ambulatory Visit: Payer: Self-pay

## 2018-09-19 VITALS — Ht 70.0 in | Wt 140.0 lb

## 2018-09-19 DIAGNOSIS — I63511 Cerebral infarction due to unspecified occlusion or stenosis of right middle cerebral artery: Secondary | ICD-10-CM

## 2018-09-19 DIAGNOSIS — I739 Peripheral vascular disease, unspecified: Secondary | ICD-10-CM | POA: Diagnosis not present

## 2018-09-19 DIAGNOSIS — I96 Gangrene, not elsewhere classified: Secondary | ICD-10-CM

## 2018-09-19 NOTE — Progress Notes (Signed)
Office Visit Note   Patient: Jesus Lin           Date of Birth: [birth year]           MRN: [id number] Visit Date: 09/19/2018              Requested by: Rosita Fire, MD [address] [place],  [place] [address] PCP: Rosita Fire, MD  Chief Complaint  Patient presents with  . Right Foot - Follow-up    2nd and 3rd toes       HPI: The patient is a 73 year old gentleman who is seen for follow-up of gangrene of his right second and third toes.  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  He is at [facility] facility.  He presents with staff today from there.  He had work-up of bilateral lower extremity vasculature with an aortogram which showed severe unreconstructable tibial artery occlusive disease. He reports some continued discomfort over the toes but no open drainage. He reports he does not desire to have any type of surgery at this time.  Assessment & Plan: Visit Diagnoses:  1. Gangrene of toe of right foot ([place])   2. PAD (peripheral artery disease) ([place])     Plan: Orders were sent to begin nitroglycerin patch to the right foot and alternate sites over the foot daily as well as Trental 400 mg 3 times daily to try to augment blood flow. Instructed the patient and nursing facility staff that should he develop any open ulceration or drainage signs of cellulitis or infection that he should be evaluated immediately.  At this point the patient's gangrene appears dry and over time he may auto amputate.  The patient prefers to continue to try conservative treatments and does not desire any surgical intervention at this time.  Follow-Up Instructions: Return in about 6 weeks (around 10/31/2018).   Ortho Exam  Patient is alert, oriented, no adenopathy, well-dressed, normal affect, normal respiratory effort. Patient presents in a wheelchair.  He has a left hemiplegia from previous stroke.   He has ischemic changes of the right foot particularly over the second third and fourth toes which is essentially unchanged from his previous visit.  There are no open areas of ulceration.  Attempted to trim the patient's onychomycotic nails however he stated that this was too painful and he did not want them trimmed.  Pulses were again checked by Doppler in the office and are biphasic in the right foot.  There are no current signs of cellulitis of the foot or infection of the foot.  Will try nitroglycerin patch and trying Tylenol and continued observation.  Imaging: No results found. No images are attached to the encounter.  Labs: Lab Results  Component Value Date   HGBA1C 5.6 08/25/2011   LABURIC 8.9 (H) 08/25/2011     Lab Results  Component Value Date   ALBUMIN 2.9 (L) 04/09/2013   ALBUMIN 3.1 (L) 12/16/2012   ALBUMIN 3.3 (L) 09/22/2012   LABURIC 8.9 (H) 08/25/2011    Body mass index is 20.09 kg/m.  Orders:  No orders of the defined types were placed in this encounter.  No orders of the defined types were placed in this encounter.    Procedures: No procedures performed  Clinical Data: No additional findings.  ROS:  All other systems negative, except as noted in the HPI. Review of Systems  Objective: Vital Signs: Ht 5\' 10"  (  1.778 m)   Wt 140 lb (63.5 kg)   BMI 20.09 kg/m   Specialty Comments:  No specialty comments available.  PMFS History: Patient Active Problem List   Diagnosis Date Noted  . Alcohol withdrawal seizure (HCC) 04/09/2013  . Other convulsions 12/16/2012  . Hemiplegia, unspecified, affecting nondominant side 12/16/2012  . Cocaine abuse (HCC) 12/16/2012  . Seizures (HCC)   . Tobacco abuse 08/26/2011  . Acute gout 08/25/2011  . Acute ischemic right MCA stroke (HCC) 08/24/2011  . Alcohol abuse 08/24/2011   Past Medical History:  Diagnosis Date  . Alcohol abuse   . COPD (chronic obstructive pulmonary disease) (HCC)   . Gout   .  Hypertension   . Seizures (HCC)   . Stroke [facility](HCC)     History reviewed. No pertinent family history.  Past Surgical History:  Procedure Laterality Date  . PERIPHERAL VASCULAR CATHETERIZATION N/A 07/30/2015   Procedure: Abdominal Aortogram w/ Bilateral Lower Extremity Runoff;  Surgeon: Sherren Kernsharles E Fields, MD;  [facility];  Service: Cardiovascular;  Laterality: N/A;   Social History   Occupational History  . Not on file  Tobacco Use  . Smoking status: Current Every Day Smoker    Packs/day: 1.00    Types: Cigarettes  . Smokeless tobacco: Never Used  Substance and Sexual Activity  . Alcohol use: Yes    Alcohol/week: 0.0 standard drinks    Comment: liquor daily  . Drug use: No  . Sexual activity: Not on file

## 2018-10-22 ENCOUNTER — Ambulatory Visit (INDEPENDENT_AMBULATORY_CARE_PROVIDER_SITE_OTHER): Payer: Medicare Other | Admitting: Internal Medicine

## 2018-10-22 ENCOUNTER — Encounter: Payer: Self-pay | Admitting: Internal Medicine

## 2018-10-22 ENCOUNTER — Other Ambulatory Visit: Payer: Self-pay

## 2018-10-22 DIAGNOSIS — I63511 Cerebral infarction due to unspecified occlusion or stenosis of right middle cerebral artery: Secondary | ICD-10-CM | POA: Diagnosis not present

## 2018-10-22 DIAGNOSIS — I96 Gangrene, not elsewhere classified: Secondary | ICD-10-CM | POA: Diagnosis present

## 2018-10-22 DIAGNOSIS — I7090 Unspecified atherosclerosis: Secondary | ICD-10-CM | POA: Diagnosis not present

## 2018-10-22 NOTE — Addendum Note (Signed)
Addended by: Michel Bickers on: 10/22/2018 03:06 PM   Modules accepted: Orders

## 2018-10-22 NOTE — Progress Notes (Addendum)
[facility] for Infectious Disease  Reason for Consult: Concern for right foot osteomyelitis Referring Provider: Paulina Fusi NP  Assessment: I do not see evidence of active infection at this time and would recommend stopping vancomycin and piperacillin tazobactam and having his PICC line removed unless he is using it for other medications.  Plan: 1. Recommend stopping vancomycin and Pipracil and tazobactam now  Patient Active Problem List   Diagnosis Date Noted  . Gangrene of toe of right foot ([place]) 10/22/2018    Priority: High  . ASVD (arteriosclerotic vascular disease) 10/22/2018  . Alcohol withdrawal seizure ([place]) 04/09/2013  . Other convulsions 12/16/2012  . Hemiplegia, unspecified, affecting nondominant side 12/16/2012  . Cocaine abuse ([place]) 12/16/2012  . Seizures ([place])   . Tobacco abuse 08/26/2011  . History of gout 08/25/2011  . Acute ischemic right MCA stroke ([place]) 08/24/2011  . Alcohol abuse 08/24/2011    Patient's Medications  New Prescriptions   No medications on file  Previous Medications   AMINO ACIDS-PROTEIN HYDROLYS (FEEDING SUPPLEMENT, PRO-STAT SUGAR FREE 64,) LIQD    Take 30 mLs by mouth 3 (three) times daily with meals.   ASPIRIN EC 81 MG TABLET    Take 81 mg by mouth daily.   ATENOLOL (TENORMIN) 25 MG TABLET    Take 25 mg by mouth daily.   B COMPLEX-VITAMIN C-FOLIC ACID (NEPHRO-VITE) 0.8 MG TABS TABLET    Take 1 tablet by mouth at bedtime.   HYDROXYZINE (ATARAX/VISTARIL) 25 MG TABLET    Take 25 mg by mouth daily.   LEVETIRACETAM (KEPPRA) 500 MG TABLET    Take 1 tablet (500 mg total) by mouth 2 (two) times daily.   MIRTAZAPINE (REMERON) 7.5 MG TABLET    Take 7.5 mg by mouth at bedtime.   THIAMINE (VITAMIN B-1) 100 MG TABLET    Take 1 tablet (100 mg total) by mouth daily.   TRAMADOL (ULTRAM) 50 MG TABLET    Take 50 mg by mouth 2 (two) times daily.   VITAMIN D, ERGOCALCIFEROL, (DRISDOL) 50000 UNITS CAPS CAPSULE    Take 50,000 Units by mouth  every 30 (thirty) days.  Modified Medications   No medications on file  Discontinued Medications   DOXYCYCLINE (DORYX) 100 MG EC TABLET    Take 100 mg by mouth 2 (two) times daily.   PIPERACILLIN-TAZOBACTAM (ZOSYN) IVPB    Inject 3.375 g into the vein every 6 (six) hours.   VANCOMYCIN 1,250 MG IN SODIUM CHLORIDE 0.9 % 150 ML    Inject 1,250 mg into the vein daily.    HPI: Jesus Lin is a 73 y.o. male with atherosclerotic peripheral vascular disease.  He suffered a right CVA leaving him with left-sided weakness several years ago.  He has lived in a skilled nursing facility ever since that time.  It seems as though he is largely confined to a wheelchair when not in bed.  At some point he developed darkening of the right second and third toes.  He was seen by Katharina Caper PA (orthopedics) on 08/20/2018.  An x-ray was done which did not show any evidence of osteomyelitis.  She diagnosed dry gangrene.  She saw him back on 09/19/2018 and again confirmed dry gangrene without signs of infection.  She started treatment with a nitroglycerin patch and Trental.  Around that time he was started on IV vancomycin and piperacillin tazobactam.  I cannot determine from the notes provided to me who started the  antibiotic or why.  He is here today for me to help determine when the antibiotics can be stopped.  Neither he nor the CMA who is with him today can provide more detail about what has happened recently.  They are not sure when his PICC line was placed.  Review of Systems: Review of Systems  Unable to perform ROS: Mental acuity      Past Medical History:  Diagnosis Date  . Alcohol abuse   . COPD (chronic obstructive pulmonary disease) (HCC)   . Gout   . Hypertension   . Seizures (HCC)   . Stroke [facility](HCC)     Social History   Tobacco Use  . Smoking status: Current Every Day Smoker    Packs/day: 1.00    Types: Cigarettes  . Smokeless tobacco: Never Used  Substance Use Topics  . Alcohol use:  Yes    Alcohol/week: 0.0 standard drinks    Comment: liquor daily  . Drug use: No    No family history on file. No Known Allergies  OBJECTIVE: Vitals:   10/22/18 1441  BP: 123/70  Pulse: 69  Temp: 98 F (36.7 C)   There is no height or weight on file to calculate BMI.   Physical Exam Constitutional:      Comments: He is alert and pleasant seated in a wheelchair.  Musculoskeletal:     Comments: His right foot appears warm and well-perfused.  He has thickened, dystrophic toenails.  The right second and third toes are slightly darker than his other toes but certainly do not show evidence of dry gangrene currently.  He has no open wounds.  There is no obvious cellulitis and there is no odor.       Microbiology: No results found for this or any previous visit (from the past 240 hour(s)).  Cliffton AstersJohn Catori Panozzo, MD [facility] [phone] pager   [phone] cell 10/22/2018, 3:05 PM

## 2018-10-31 ENCOUNTER — Other Ambulatory Visit: Payer: Self-pay

## 2018-10-31 ENCOUNTER — Encounter: Payer: Self-pay | Admitting: Orthopedic Surgery

## 2018-10-31 ENCOUNTER — Ambulatory Visit (INDEPENDENT_AMBULATORY_CARE_PROVIDER_SITE_OTHER): Payer: Medicare Other | Admitting: Orthopedic Surgery

## 2018-10-31 VITALS — Ht 70.0 in | Wt 140.0 lb

## 2018-10-31 DIAGNOSIS — I63511 Cerebral infarction due to unspecified occlusion or stenosis of right middle cerebral artery: Secondary | ICD-10-CM | POA: Diagnosis not present

## 2018-10-31 DIAGNOSIS — B351 Tinea unguium: Secondary | ICD-10-CM

## 2018-10-31 NOTE — Progress Notes (Signed)
Office Visit Note   Patient: Jesus Lin Jesus Lin           Date of Birth: 09/03/1945           MRN: [id number] Visit Date: 10/31/2018              Requested by: Avon GullyFanta, Tesfaye, MD [address] [place],  [place] [id number] PCP: Avon GullyFanta, Tesfaye, MD  Chief Complaint  Patient presents with  . Right Foot - Follow-up      HPI: Patient is seen in follow-up for right foot with ischemic changes he was initially started on Trental and nitroglycerin patch.  Patient complains of painful onychomycotic nails.  Assessment & Plan: Visit Diagnoses:  1. Onychomycosis     Plan: Nails were trimmed x5 without complications patient has a palpable pulse at this time.  He does have microcirculatory disorder but no gangrenous changes.  Will reevaluate in 3 months.  Follow-Up Instructions: Return in about 3 months (around 01/31/2019).   Ortho Exam  Patient is alert, oriented, no adenopathy, well-dressed, normal affect, normal respiratory effort. Semination patient has a palpable dorsalis pedis pulse.  He has dry flaky cracked skin on his right foot this was gently debrided there are no gangrenous changes no ulcers no drainage.  He has thickened discolored onychomycotic nails that he is unable to safely trim on his own and the nails were trimmed x5 without complications.  Imaging: No results found. No images are attached to the encounter.  Labs: Lab Results  Component Value Date   HGBA1C 5.6 08/25/2011   LABURIC 8.9 (Jesus) 08/25/2011     Lab Results  Component Value Date   ALBUMIN 2.9 (L) 04/09/2013   ALBUMIN 3.1 (L) 12/16/2012   ALBUMIN 3.3 (L) 09/22/2012   LABURIC 8.9 (Jesus) 08/25/2011    No results found for: MG No results found for: VD25OH  No results found for: PREALBUMIN CBC EXTENDED Latest Ref Rng & Units 07/30/2015 04/12/2013 04/09/2013  WBC 4.0 - 10.5 K/uL - 5.5 3.9(L)  RBC 4.22 - 5.81 MIL/uL - 4.27 4.41  HGB 13.0 - 17.0 g/dL 19.113.6 47.813.0 29.513.4  HCT 62.139.0 - 52.0 % 40.0 39.8 41.8   PLT 150 - 400 K/uL - 200 284  NEUTROABS 1.7 - 7.7 K/uL - - 3.3  LYMPHSABS 0.7 - 4.0 K/uL - - 0.5(L)     Body mass index is 20.09 kg/m.  Orders:  No orders of the defined types were placed in this encounter.  No orders of the defined types were placed in this encounter.    Procedures: No procedures performed  Clinical Data: No additional findings.  ROS:  All other systems negative, except as noted in the HPI. Review of Systems  Objective: Vital Signs: Ht 5\' 10"  (1.778 m)   Wt 140 lb (63.5 kg)   BMI 20.09 kg/m   Specialty Comments:  No specialty comments available.  PMFS History: Patient Active Problem List   Diagnosis Date Noted  . Gangrene of toe of right foot (HCC) 10/22/2018  . ASVD (arteriosclerotic vascular disease) 10/22/2018  . Alcohol withdrawal seizure (HCC) 04/09/2013  . Other convulsions 12/16/2012  . Hemiplegia, unspecified, affecting nondominant side 12/16/2012  . Cocaine abuse (HCC) 12/16/2012  . Seizures (HCC)   . Tobacco abuse 08/26/2011  . History of gout 08/25/2011  . Acute ischemic right MCA stroke (HCC) 08/24/2011  . Alcohol abuse 08/24/2011   Past Medical History:  Diagnosis Date  . Alcohol abuse   . COPD (chronic obstructive pulmonary  disease) ([place])   . Gout   . Hypertension   . Seizures ([place])   . Stroke [facility])     History reviewed. No pertinent family history.  Past Surgical History:  Procedure Laterality Date  . PERIPHERAL VASCULAR CATHETERIZATION N/A 07/30/2015   Procedure: Abdominal Aortogram w/ Bilateral Lower Extremity Runoff;  Surgeon: Elam Dutch, MD;  Location: [facility] CV LAB;  Service: Cardiovascular;  Laterality: N/A;   Social History   Occupational History  . Not on file  Tobacco Use  . Smoking status: Current Every Day Smoker    Packs/day: 1.00    Types: Cigarettes  . Smokeless tobacco: Never Used  Substance and Sexual Activity  . Alcohol use: Yes    Alcohol/week: 0.0 standard drinks    Comment:  liquor daily  . Drug use: No  . Sexual activity: Not on file

## 2018-11-04 ENCOUNTER — Other Ambulatory Visit: Payer: Self-pay

## 2018-11-04 DIAGNOSIS — I96 Gangrene, not elsewhere classified: Secondary | ICD-10-CM

## 2018-11-05 ENCOUNTER — Telehealth: Payer: Self-pay | Admitting: Orthopedic Surgery

## 2018-11-05 NOTE — Telephone Encounter (Signed)
I called and sw April she questioned if the pt should continue with trental and nitro patch. I advised that the pt does have microcirculation problems and that per the NP yes the pt should continue with meds.

## 2018-11-05 NOTE — Telephone Encounter (Signed)
April from [facility] in [place] called needing to speak to you in regards to this patient.  She stated that she had some questions.  CB#[phone].  Thank you.

## 2018-11-05 NOTE — Telephone Encounter (Signed)
I called and the mailbox is full. I will hold onto this message and try to reach again later.

## 2018-11-06 ENCOUNTER — Encounter: Payer: Self-pay | Admitting: Vascular Surgery

## 2018-11-06 ENCOUNTER — Other Ambulatory Visit: Payer: Self-pay

## 2018-11-06 ENCOUNTER — Ambulatory Visit (HOSPITAL_COMMUNITY)
Admission: RE | Admit: 2018-11-06 | Discharge: 2018-11-06 | Disposition: A | Discharge status: Home / Self Care | Payer: Medicare Other | Source: Ambulatory Visit | Attending: Vascular Surgery | Admitting: Vascular Surgery

## 2018-11-06 ENCOUNTER — Encounter: Payer: Medicare Other | Admitting: Vascular Surgery

## 2018-11-06 ENCOUNTER — Ambulatory Visit (INDEPENDENT_AMBULATORY_CARE_PROVIDER_SITE_OTHER): Payer: Medicare Other | Admitting: Vascular Surgery

## 2018-11-06 ENCOUNTER — Encounter (HOSPITAL_COMMUNITY): Payer: Medicare Other

## 2018-11-06 VITALS — BP 120/85 | HR 62 | Temp 97.2°F | Ht 70.0 in | Wt 140.0 lb

## 2018-11-06 DIAGNOSIS — I63511 Cerebral infarction due to unspecified occlusion or stenosis of right middle cerebral artery: Secondary | ICD-10-CM

## 2018-11-06 DIAGNOSIS — I96 Gangrene, not elsewhere classified: Secondary | ICD-10-CM

## 2018-11-06 DIAGNOSIS — I739 Peripheral vascular disease, unspecified: Secondary | ICD-10-CM | POA: Diagnosis not present

## 2018-11-06 NOTE — Progress Notes (Signed)
Referring Physician: Dr Charlynne PanderJoel Blass  Patient name: Jesus Lin MRN: [id number] DOB: 10/02/1945 Sex: male  REASON FOR CONSULT: Possible gangrene right foot  HPI: Jesus Lin is a 73 y.o. male, who was referred for evaluation of possible gangrenous changes right foot.  I had previously seen this patient in 2017.  He had an arteriogram at that time which showed unreconstructable tibial artery occlusive disease.  At that time recommendation was that he would need a above-knee amputation if his wounds failed to heal.  The wound on the left foot did spontaneously heal.  He currently does not have any open wounds on his foot. He complains of pain in the right second toe.  The patient overall is fairly debilitated.  He is essentially wheelchair-bound.  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  He has had a prior stroke.  He was seen by Dr. Lajoyce Cornersuda approximately 1 week ago.  At that point Dr. Lajoyce Cornersuda also agreed there was no open wound on his toe and thought that he had primarily onychomycosis.  He has appointment scheduled for follow-up with Dr. Lajoyce Cornersuda in 3 months.  Other medical problems include COPD, hypertension, seizures all of which are currently stable.  He is on aspirin 81 mg once a day.  Past Medical History:  Diagnosis Date  . Alcohol abuse   . COPD (chronic obstructive pulmonary disease) (HCC)   . Gout   . Hypertension   . Seizures (HCC)   . Stroke [facility](HCC)    Past Surgical History:  Procedure Laterality Date  . PERIPHERAL VASCULAR CATHETERIZATION N/A 07/30/2015   Procedure: Abdominal Aortogram w/ Bilateral Lower Extremity Runoff;  Surgeon: Sherren Kernsharles E Devereaux Grayson, MD;  [facility];  Service: Cardiovascular;  Laterality: N/A;    No family history on file.  SOCIAL HISTORY: Social History   Socioeconomic History  . Marital status: Divorced    Spouse name: Not on file  . Number of children: Not on file  . Years of education: Not on file  . Highest education  level: Not on file  Occupational History  . Not on file  Social Needs  . Financial resource strain: Not on file  . Food insecurity    Worry: Not on file    Inability: Not on file  . Transportation needs    Medical: Not on file    Non-medical: Not on file  Tobacco Use  . Smoking status: Current Every Day Smoker    Packs/day: 1.00    Types: Cigarettes  . Smokeless tobacco: Never Used  Substance and Sexual Activity  . Alcohol use: Yes    Alcohol/week: 0.0 standard drinks    Comment: liquor daily  . Drug use: No  . Sexual activity: Not on file  Lifestyle  . Physical activity    Days per week: Not on file    Minutes per session: Not on file  . Stress: Not on file  Relationships  . Social Musicianconnections    Talks on phone: Not on file    Gets together: Not on file    Attends religious service: Not on file    Active member of club or organization: Not on file    Attends meetings of clubs or organizations: Not on file    Relationship status: Not on file  . Intimate partner violence    Fear of current or ex partner: Not on file    Emotionally abused: Not on file    Physically  abused: Not on file    Forced sexual activity: Not on file  Other Topics Concern  . Not on file  Social History Narrative  . Not on file    No Known Allergies  Current Outpatient Medications  Medication Sig Dispense Refill  . Amino Acids-Protein Hydrolys (FEEDING SUPPLEMENT, PRO-STAT SUGAR FREE 64,) LIQD Take 30 mLs by mouth 3 (three) times daily with meals.    Marland Kitchen. aspirin EC 81 MG tablet Take 81 mg by mouth daily.    Marland Kitchen. atenolol (TENORMIN) 25 MG tablet Take 25 mg by mouth daily.    Marland Kitchen. b complex-vitamin c-folic acid (NEPHRO-VITE) 0.8 MG TABS tablet Take 1 tablet by mouth at bedtime.    . hydrOXYzine (ATARAX/VISTARIL) 25 MG tablet Take 25 mg by mouth daily.    Marland Kitchen. levETIRAcetam (KEPPRA) 500 MG tablet Take 1 tablet (500 mg total) by mouth 2 (two) times daily. 60 tablet 0  . mirtazapine (REMERON) 7.5 MG tablet  Take 7.5 mg by mouth at bedtime.    . thiamine (VITAMIN B-1) 100 MG tablet Take 1 tablet (100 mg total) by mouth daily. 30 tablet 3  . traMADol (ULTRAM) 50 MG tablet Take 50 mg by mouth 2 (two) times daily.    . Vitamin D, Ergocalciferol, (DRISDOL) 50000 units CAPS capsule Take 50,000 Units by mouth every 30 (thirty) days.     No current facility-administered medications for this visit.     ROS:   General:  No weight loss, Fever, chills  Cardiac: No recent episodes of chest pain/pressure, no shortness of breath at rest.  + shortness of breath with exertion.  Denies history of atrial fibrillation or irregular heartbeat  Vascular: No history of rest pain in feet.  No history of claudication.  + history of non-healing ulcer, No history of DVT   Pulmonary: No home oxygen, no productive cough, no hemoptysis,  No asthma or wheezing  Skin: No rashes   Physical Examination  Vitals:   11/06/18 0854  BP: 120/85  Pulse: 62  Temp: (!) 97.2 F (36.2 C)  TempSrc: Temporal  Weight: 140 lb (63.5 kg)  Height: 5\' 10"  (1.778 m)    Body mass index is 20.09 kg/m.  General:  Alert and oriented, no acute distress HEENT: Normal Skin: No rash, dry scaly skin involving toes of both feet and onto the dorsum of the foot no open wound no erythema no fluctuance Extremity Pulses:  2+ radial, brachial, femoral, absent popliteal dorsalis pedis, posterior tibial pulses bilaterally Musculoskeletal: Multiple contractures of both knees and left arm Neurologic: Upper and lower extremity motor 3-4/5 and symmetric  DATA:  Patient had bilateral ABIs performed today.  These were technically difficult due to the patient's wheelchair status.  Although ABIs were in the normal range bilaterally I suspect he has vessel calcification with artificial inflation since the waveforms are monophasic.  This is also in light of his known previous arteriogram which shows severe tibial artery occlusive disease.  He had  monophasic waveforms in the tibial vessels bilaterally.  He had a toe pressure of 0 bilaterally.  ASSESSMENT: Patient currently with no open wounds on right or left foot.  Unfortunately he has unreconstructable arterial occlusive disease dating back to 2017.  He is not a candidate for any further revascularization procedures.  Only option long-term would be above-knee amputation if he has nonhealing wounds of his foot or a sending infection or needed this for significant pain control.  He currently does not have any of these  indications.  Patient has follow-up scheduled with Dr. Sharol Given in 3 months.   PLAN: He can follow-up with me on as-needed basis.   Ruta Hinds, MD [facility] Office: [phone] Pager: [phone]

## 2019-01-30 ENCOUNTER — Ambulatory Visit: Payer: Medicare Other | Admitting: Orthopedic Surgery

## 2019-02-03 ENCOUNTER — Ambulatory Visit: Payer: Medicare Other | Admitting: Orthopedic Surgery

## 2019-02-10 ENCOUNTER — Telehealth: Payer: Self-pay | Admitting: Orthopedic Surgery

## 2019-02-10 ENCOUNTER — Ambulatory Visit: Payer: Medicare Other | Admitting: Orthopedic Surgery

## 2019-02-10 NOTE — Telephone Encounter (Signed)
Can you please call and advise that Dr. Sharol Given does not do virtual visits. If they need to reschedule appt for today that's ok.

## 2019-02-10 NOTE — Telephone Encounter (Signed)
Received call from Lynita Lombard with [facility] asked if the patient's appointment can be done by virtual visit. Patient would have to quarantine after visit if he comes into the office. The number to contact Hassan Rowan is [phone]

## 2019-04-01 ENCOUNTER — Emergency Department (HOSPITAL_COMMUNITY): Payer: Medicare Other

## 2019-04-01 ENCOUNTER — Encounter (HOSPITAL_COMMUNITY): Payer: Self-pay | Admitting: Emergency Medicine

## 2019-04-01 ENCOUNTER — Inpatient Hospital Stay (HOSPITAL_COMMUNITY)
Admission: EM | Admit: 2019-04-01 | Discharge: 2019-05-05 | DRG: 177 | Disposition: E | Payer: Medicare Other | Attending: Internal Medicine | Admitting: Internal Medicine

## 2019-04-01 ENCOUNTER — Other Ambulatory Visit: Payer: Self-pay

## 2019-04-01 DIAGNOSIS — I10 Essential (primary) hypertension: Secondary | ICD-10-CM | POA: Diagnosis present

## 2019-04-01 DIAGNOSIS — I469 Cardiac arrest, cause unspecified: Secondary | ICD-10-CM | POA: Diagnosis not present

## 2019-04-01 DIAGNOSIS — G40909 Epilepsy, unspecified, not intractable, without status epilepticus: Secondary | ICD-10-CM | POA: Diagnosis present

## 2019-04-01 DIAGNOSIS — L89894 Pressure ulcer of other site, stage 4: Secondary | ICD-10-CM | POA: Diagnosis present

## 2019-04-01 DIAGNOSIS — R531 Weakness: Secondary | ICD-10-CM

## 2019-04-01 DIAGNOSIS — L89152 Pressure ulcer of sacral region, stage 2: Secondary | ICD-10-CM | POA: Diagnosis present

## 2019-04-01 DIAGNOSIS — F17210 Nicotine dependence, cigarettes, uncomplicated: Secondary | ICD-10-CM | POA: Diagnosis present

## 2019-04-01 DIAGNOSIS — E46 Unspecified protein-calorie malnutrition: Secondary | ICD-10-CM | POA: Diagnosis present

## 2019-04-01 DIAGNOSIS — I7090 Unspecified atherosclerosis: Secondary | ICD-10-CM | POA: Diagnosis present

## 2019-04-01 DIAGNOSIS — J690 Pneumonitis due to inhalation of food and vomit: Secondary | ICD-10-CM | POA: Diagnosis not present

## 2019-04-01 DIAGNOSIS — R7401 Elevation of levels of liver transaminase levels: Secondary | ICD-10-CM | POA: Diagnosis present

## 2019-04-01 DIAGNOSIS — M109 Gout, unspecified: Secondary | ICD-10-CM | POA: Diagnosis present

## 2019-04-01 DIAGNOSIS — F1011 Alcohol abuse, in remission: Secondary | ICD-10-CM | POA: Diagnosis present

## 2019-04-01 DIAGNOSIS — N179 Acute kidney failure, unspecified: Secondary | ICD-10-CM | POA: Diagnosis not present

## 2019-04-01 DIAGNOSIS — U071 COVID-19: Principal | ICD-10-CM

## 2019-04-01 DIAGNOSIS — R001 Bradycardia, unspecified: Secondary | ICD-10-CM | POA: Diagnosis present

## 2019-04-01 DIAGNOSIS — I70209 Unspecified atherosclerosis of native arteries of extremities, unspecified extremity: Secondary | ICD-10-CM | POA: Diagnosis present

## 2019-04-01 DIAGNOSIS — J449 Chronic obstructive pulmonary disease, unspecified: Secondary | ICD-10-CM | POA: Diagnosis present

## 2019-04-01 DIAGNOSIS — K920 Hematemesis: Secondary | ICD-10-CM

## 2019-04-01 DIAGNOSIS — I452 Bifascicular block: Secondary | ICD-10-CM | POA: Diagnosis present

## 2019-04-01 DIAGNOSIS — A0472 Enterocolitis due to Clostridium difficile, not specified as recurrent: Secondary | ICD-10-CM | POA: Diagnosis present

## 2019-04-01 DIAGNOSIS — J9601 Acute respiratory failure with hypoxia: Secondary | ICD-10-CM

## 2019-04-01 DIAGNOSIS — Z79899 Other long term (current) drug therapy: Secondary | ICD-10-CM

## 2019-04-01 DIAGNOSIS — Z7982 Long term (current) use of aspirin: Secondary | ICD-10-CM

## 2019-04-01 DIAGNOSIS — J1282 Pneumonia due to coronavirus disease 2019: Secondary | ICD-10-CM | POA: Diagnosis present

## 2019-04-01 DIAGNOSIS — I69354 Hemiplegia and hemiparesis following cerebral infarction affecting left non-dominant side: Secondary | ICD-10-CM

## 2019-04-01 DIAGNOSIS — J440 Chronic obstructive pulmonary disease with acute lower respiratory infection: Secondary | ICD-10-CM | POA: Diagnosis present

## 2019-04-01 DIAGNOSIS — F1411 Cocaine abuse, in remission: Secondary | ICD-10-CM | POA: Diagnosis present

## 2019-04-01 DIAGNOSIS — E860 Dehydration: Secondary | ICD-10-CM | POA: Diagnosis present

## 2019-04-01 DIAGNOSIS — N3001 Acute cystitis with hematuria: Secondary | ICD-10-CM

## 2019-04-01 DIAGNOSIS — E875 Hyperkalemia: Secondary | ICD-10-CM

## 2019-04-01 DIAGNOSIS — Z6820 Body mass index (BMI) 20.0-20.9, adult: Secondary | ICD-10-CM

## 2019-04-01 LAB — URINALYSIS, ROUTINE W REFLEX MICROSCOPIC
Bilirubin Urine: NEGATIVE
Glucose, UA: NEGATIVE mg/dL
Ketones, ur: NEGATIVE mg/dL
Leukocytes,Ua: NEGATIVE
Nitrite: NEGATIVE
Protein, ur: 100 mg/dL — AB
RBC / HPF: >50 RBC/hpf — ABNORMAL HIGH (ref 0–5)
Specific Gravity, Urine: 1.027 (ref 1.005–1.030)
pH: 5 (ref 5.0–8.0)

## 2019-04-01 LAB — COMPREHENSIVE METABOLIC PANEL
ALT: 68 U/L — ABNORMAL HIGH (ref 0–44)
AST: 79 U/L — ABNORMAL HIGH (ref 15–41)
Albumin: 3.2 g/dL — ABNORMAL LOW (ref 3.5–5.0)
Alkaline Phosphatase: 127 U/L — ABNORMAL HIGH (ref 38–126)
Anion gap: 10 (ref 5–15)
BUN: 33 mg/dL — ABNORMAL HIGH (ref 8–23)
CO2: 22 mmol/L (ref 22–32)
Calcium: 8.6 mg/dL — ABNORMAL LOW (ref 8.9–10.3)
Chloride: 108 mmol/L (ref 98–111)
Creatinine, Ser: 2.02 mg/dL — ABNORMAL HIGH (ref 0.61–1.24)
GFR calc Af Amer: 37 mL/min — ABNORMAL LOW (ref >60)
GFR calc non Af Amer: 32 mL/min — ABNORMAL LOW (ref >60)
Glucose, Bld: 92 mg/dL (ref 70–99)
Potassium: 6.1 mmol/L — ABNORMAL HIGH (ref 3.5–5.1)
Sodium: 140 mmol/L (ref 135–145)
Total Bilirubin: 0.8 mg/dL (ref 0.3–1.2)
Total Protein: 8.2 g/dL — ABNORMAL HIGH (ref 6.5–8.1)

## 2019-04-01 LAB — CBC WITH DIFFERENTIAL/PLATELET
Abs Immature Granulocytes: 0.08 10*3/uL — ABNORMAL HIGH (ref 0.00–0.07)
Basophils Absolute: 0 10*3/uL (ref 0.0–0.1)
Basophils Relative: 0 %
Eosinophils Absolute: 0 10*3/uL (ref 0.0–0.5)
Eosinophils Relative: 0 %
HCT: 42.4 % (ref 39.0–52.0)
Hemoglobin: 13 g/dL (ref 13.0–17.0)
Immature Granulocytes: 1 %
Lymphocytes Relative: 4 %
Lymphs Abs: 0.7 10*3/uL (ref 0.7–4.0)
MCH: 25.5 pg — ABNORMAL LOW (ref 26.0–34.0)
MCHC: 30.7 g/dL (ref 30.0–36.0)
MCV: 83.1 fL (ref 80.0–100.0)
Monocytes Absolute: 0.7 10*3/uL (ref 0.1–1.0)
Monocytes Relative: 5 %
Neutro Abs: 13.6 10*3/uL — ABNORMAL HIGH (ref 1.7–7.7)
Neutrophils Relative %: 90 %
Platelets: 239 10*3/uL (ref 150–400)
RBC: 5.1 MIL/uL (ref 4.22–5.81)
RDW: 14.2 % (ref 11.5–15.5)
WBC: 15.1 10*3/uL — ABNORMAL HIGH (ref 4.0–10.5)
nRBC: 0 % (ref 0.0–0.2)

## 2019-04-01 LAB — FIBRINOGEN: Fibrinogen: 697 mg/dL — ABNORMAL HIGH (ref 210–475)

## 2019-04-01 LAB — FERRITIN: Ferritin: 383 ng/mL — ABNORMAL HIGH (ref 24–336)

## 2019-04-01 LAB — PHOSPHORUS: Phosphorus: 3.1 mg/dL (ref 2.5–4.6)

## 2019-04-01 LAB — BRAIN NATRIURETIC PEPTIDE: B Natriuretic Peptide: 88 pg/mL (ref 0.0–100.0)

## 2019-04-01 LAB — D-DIMER, QUANTITATIVE: D-Dimer, Quant: 1.56 ug/mL-FEU — ABNORMAL HIGH (ref 0.00–0.50)

## 2019-04-01 LAB — PROCALCITONIN: Procalcitonin: 4.07 ng/mL

## 2019-04-01 LAB — CK: Total CK: 53 U/L (ref 49–397)

## 2019-04-01 LAB — LACTATE DEHYDROGENASE: LDH: 131 U/L (ref 98–192)

## 2019-04-01 LAB — C-REACTIVE PROTEIN: CRP: 17.8 mg/dL — ABNORMAL HIGH (ref <1.0)

## 2019-04-01 MED ORDER — HYDROCOD POLST-CPM POLST ER 10-8 MG/5ML PO SUER
5.0000 mL | Freq: Two times a day (BID) | ORAL | Status: DC | PRN
  Administered 2019-04-03 – 2019-04-04 (×2): 5 mL via ORAL
  Filled 2019-04-01 (×2): qty 5

## 2019-04-01 MED ORDER — DEXTROSE IN LACTATED RINGERS 5 % IV SOLN: INTRAVENOUS | Status: DC

## 2019-04-01 MED ORDER — GUAIFENESIN-DM 100-10 MG/5ML PO SYRP
10.0000 mL | ORAL_SOLUTION | ORAL | Status: DC | PRN
  Administered 2019-04-05: 06:00:00 10 mL via ORAL
  Filled 2019-04-01 (×2): qty 10

## 2019-04-01 MED ORDER — ONDANSETRON HCL 4 MG PO TABS: 4.0000 mg | ORAL_TABLET | Freq: Four times a day (QID) | ORAL | Status: DC | PRN

## 2019-04-01 MED ORDER — ENOXAPARIN SODIUM 30 MG/0.3ML ~~LOC~~ SOLN
30.0000 mg | SUBCUTANEOUS | Status: DC
  Administered 2019-04-01: 22:00:00 30 mg via SUBCUTANEOUS
  Filled 2019-04-01: qty 0.3

## 2019-04-01 MED ORDER — LACTATED RINGERS IV BOLUS
1000.0000 mL | Freq: Once | INTRAVENOUS | Status: AC
  Administered 2019-04-01: 1000 mL via INTRAVENOUS

## 2019-04-01 MED ORDER — SODIUM CHLORIDE 0.9 % IV BOLUS
250.0000 mL | Freq: Once | INTRAVENOUS | Status: AC
  Administered 2019-04-01: 250 mL via INTRAVENOUS

## 2019-04-01 MED ORDER — SODIUM CHLORIDE 0.9 % IV SOLN
1.0000 g | Freq: Once | INTRAVENOUS | Status: AC
  Administered 2019-04-01: 1 g via INTRAVENOUS
  Filled 2019-04-01: qty 10

## 2019-04-01 MED ORDER — ONDANSETRON HCL 4 MG/2ML IJ SOLN: 4.0000 mg | Freq: Four times a day (QID) | INTRAMUSCULAR | Status: DC | PRN

## 2019-04-01 MED ORDER — ASCORBIC ACID 500 MG PO TABS
500.0000 mg | ORAL_TABLET | Freq: Every day | ORAL | Status: DC
  Administered 2019-04-01 – 2019-04-05 (×5): 500 mg via ORAL
  Filled 2019-04-01 (×6): qty 1

## 2019-04-01 MED ORDER — ZINC SULFATE 220 (50 ZN) MG PO CAPS
220.0000 mg | ORAL_CAPSULE | Freq: Every day | ORAL | Status: DC
  Administered 2019-04-01 – 2019-04-05 (×5): 220 mg via ORAL
  Filled 2019-04-01 (×6): qty 1

## 2019-04-01 MED ORDER — SODIUM CHLORIDE 0.9 % IV SOLN: INTRAVENOUS | Status: DC

## 2019-04-01 MED ORDER — LACTATED RINGERS IV BOLUS
1000.0000 mL | Freq: Once | INTRAVENOUS | Status: AC
  Administered 2019-04-02: 1000 mL via INTRAVENOUS

## 2019-04-01 MED ORDER — ACETAMINOPHEN 650 MG RE SUPP: 650.0000 mg | Freq: Four times a day (QID) | RECTAL | Status: DC | PRN

## 2019-04-01 MED ORDER — IPRATROPIUM-ALBUTEROL 20-100 MCG/ACT IN AERS
2.0000 | INHALATION_SPRAY | Freq: Four times a day (QID) | RESPIRATORY_TRACT | Status: DC | PRN
  Filled 2019-04-01 (×2): qty 4

## 2019-04-01 MED ORDER — ACETAMINOPHEN 325 MG PO TABS: 650.0000 mg | ORAL_TABLET | Freq: Four times a day (QID) | ORAL | Status: DC | PRN

## 2019-04-01 NOTE — H&P (Signed)
History and Physical    JACINTO KEIL PRX:[id number] DOB: 12-17-1945 DOA: 03/18/2019  PCP: Avon Gully, MD   Patient coming from: Home.  I have personally briefly reviewed patient's old medical records in [facility]  Chief Complaint: Hypoxia and Covid positive.  HPI: Jesus Lin is a 73 y.o. male with medical history significant of alcohol abuse, COPD, gout, hypertension, seizure disorder, history of CVA who was brought from [facility] due to becoming COVID-19 positive with a oxygen requirement of 3 LPM via [place].  He had a negative portable x-ray yesterday.  However, the patient became lethargic with decreased urine output and decreased oral intake since yesterday evening.  He is normally alert and oriented, but unable to provide further history at this time.  ED Course: Initial vital signs temperature 98.5 F, pulse 77, respirations 20, blood pressure 113/92 mmHg and O2 sat 98% on 2 LPM via nasal cannula.  He received ceftriaxone and Zithromax in the emergency department.  While in the ED, the patient had a large loose stool BM.  This was sent for C. difficile toxin screening.  Urinalysis was cloudy, with large hemoglobinuria, proteinuria 100 mg/dL, rare bacteria, 46-28 WBC and more than 50 RBC per hpf on microscopic examination.CBC showed a white count of 15.1, hemoglobin 13.0 g/dL and platelets 638.  Potassium 6.1 mmol/L.  Rest of the electrolytes were normal when calcium corrected to albumin.  BUN was 33, creatinine 2.02 mg/dL.  Total protein 8.2 and albumin 3.2 g/dL.  AST was 79, ALT 68 and alkaline phosphatase 127.  His chest radiograph was suspicious for developing pneumonia in the left lung base.  Review of Systems: As per HPI otherwise 10 point review of systems negative.   Past Medical History:  Diagnosis Date  . Alcohol abuse   . COPD (chronic obstructive pulmonary disease) (HCC)   . Gout   . Hypertension   . Seizures (HCC)   . Stroke [facility])     Past  Surgical History:  Procedure Laterality Date  . PERIPHERAL VASCULAR CATHETERIZATION N/A 07/30/2015   Procedure: Abdominal Aortogram w/ Bilateral Lower Extremity Runoff;  Surgeon: Sherren Kerns, MD;  Location: [facility] INVASIVE CV LAB;  Service: Cardiovascular;  Laterality: N/A;     reports that he has been smoking cigarettes. He has been smoking about 1.00 pack per day. He has never used smokeless tobacco. He reports current alcohol use. He reports that he does not use drugs.  No Known Allergies     Prior to Admission medications   Medication Sig Start Date End Date Taking? Authorizing Provider  Amino Acids-Protein Hydrolys (FEEDING SUPPLEMENT, PRO-STAT SUGAR FREE 64,) LIQD Take 30 mLs by mouth 3 (three) times daily with meals.    [provider]  aspirin EC 81 MG tablet Take 81 mg by mouth daily.    [provider]  atenolol (TENORMIN) 25 MG tablet Take 25 mg by mouth daily.    [provider]  b complex-vitamin c-folic acid (NEPHRO-VITE) 0.8 MG TABS tablet Take 1 tablet by mouth at bedtime.    [provider]  hydrOXYzine (ATARAX/VISTARIL) 25 MG tablet Take 25 mg by mouth daily.    [provider]  levETIRAcetam (KEPPRA) 500 MG tablet Take 1 tablet (500 mg total) by mouth 2 (two) times daily. 12/16/12   Brandt Loosen, MD  mirtazapine (REMERON) 7.5 MG tablet Take 7.5 mg by mouth at bedtime.    [provider]  thiamine (VITAMIN B-1) 100 MG tablet Take  1 tablet (100 mg total) by mouth daily. 04/14/13   Rosita Fire, MD  traMADol (ULTRAM) 50 MG tablet Take 50 mg by mouth 2 (two) times daily.    [provider]  Vitamin D, Ergocalciferol, (DRISDOL) 50000 units CAPS capsule Take 50,000 Units by mouth every 30 (thirty) days.    [provider]    Physical Exam: Vitals:   03/24/2019 1740 03/11/2019 1741 03/17/2019 1900 03/17/2019 1930  BP:   (!) 83/59 (!) 89/58  Pulse: 67 66 66 68  Resp: 15 18 16    Temp:      TempSrc:      SpO2:  97% 97% 95% 96%  Weight:      Height:        Constitutional: Looks malnourished and chronically ill. Eyes: PERRL, lids and conjunctivae normal ENMT: Mucous membranes are mildly dry.  Posterior pharynx clear of any exudate or lesions. Neck: normal, supple, no masses, no thyromegaly Respiratory: Poor respiratory effort.  Decreased breath sounds on bases, otherwise clear to auscultation bilaterally, no wheezing, no crackles.  No accessory muscle use.  Cardiovascular: Regular rate and rhythm, no murmurs / rubs / gallops. No extremity edema. 2+ pedal pulses. No carotid bruits.  Abdomen: Soft, no tenderness, no masses palpated. No hepatosplenomegaly. Bowel sounds positive.  Musculoskeletal: no clubbing / cyanosis. Good ROM, no contractures. Normal muscle tone.  Skin: Stage II small sacral pressure injury.  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 Neurologic: Spastic left-sided hemiparesis. Psychiatric: Somnolent, oriented to name, knows he is in the hospital, but does not know which town.  Labs on Admission: I have personally reviewed following labs and imaging studies  CBC: Recent Labs  Lab 03/21/2019 1734  WBC 15.1*  NEUTROABS 13.6*  HGB 13.0  HCT 42.4  MCV 83.1  PLT 244   Basic Metabolic Panel: Recent Labs  Lab 03/31/2019 1734  NA 140  K 6.1*  CL 108  CO2 22  GLUCOSE 92  BUN 33*  CREATININE 2.02*  CALCIUM 8.6*  PHOS 3.1   GFR: Estimated Creatinine Clearance: 29.7 mL/min (A) (by C-G formula based on SCr of 2.02 mg/dL (H)). Liver Function Tests: Recent Labs  Lab 03/25/2019 1734  AST 79*  ALT 68*  ALKPHOS 127*  BILITOT 0.8  PROT 8.2*  ALBUMIN 3.2*   No results for input(s): LIPASE, AMYLASE in the last 168 hours. No results for input(s): AMMONIA in the last 168 hours. Coagulation Profile: No results for input(s): INR, PROTIME in the last 168 hours. Cardiac Enzymes: Recent Labs  Lab 03/08/2019 1734  CKTOTAL 53   BNP (last 3 results) No results for input(s): PROBNP in  the last 8760 hours. HbA1C: No results for input(s): HGBA1C in the last 72 hours. CBG: No results for input(s): GLUCAP in the last 168 hours. Lipid Profile: No results for input(s): CHOL, HDL, LDLCALC, TRIG, CHOLHDL, LDLDIRECT in the last 72 hours. Thyroid Function Tests: No results for input(s): TSH, T4TOTAL, FREET4, T3FREE, THYROIDAB in the last 72 hours. Anemia Panel: No results for input(s): VITAMINB12, FOLATE, FERRITIN, TIBC, IRON, RETICCTPCT in the last 72 hours. Urine analysis:    Component Value Date/Time   COLORURINE AMBER (A) 03/17/2019 1745   APPEARANCEUR CLOUDY (A) 03/24/2019 1745   LABSPEC 1.027 03/22/2019 1745   PHURINE 5.0 03/06/2019 1745   GLUCOSEU NEGATIVE 03/19/2019 1745   HGBUR LARGE (A) 03/17/2019 1745   BILIRUBINUR NEGATIVE 03/15/2019 1745   KETONESUR NEGATIVE 03/14/2019 1745   PROTEINUR 100 (A) 03/07/2019 1745   UROBILINOGEN  0.2 04/09/2013 0132   NITRITE NEGATIVE 03/12/2019 1745   LEUKOCYTESUR NEGATIVE 03/29/2019 1745    Radiological Exams on Admission: DG Chest Portable 1 View  Result Date: 03/25/2019 CLINICAL DATA:  Cough history of COVID. EXAM: PORTABLE CHEST 1 VIEW COMPARISON:  04/09/2013 FINDINGS: Cardiomediastinal contours are stable with along the a shin of the heart. Lungs are hyperinflated as before. Likely with some background of pulmonary emphysema. Added interstitial prominence in the left lung base is noted on today's exam with some mild blunting of the left costodiaphragmatic sulcus. Right lung is clear. No acute bone process. IMPRESSION: 1. Findings suspicious for developing pneumonia in the left lung base. 2. Probable concomitant small left effusion. 3. COPD. Electronically Signed   By: Donzetta KohutGeoffrey  Wile M.D.   On: 03/10/2019 18:29    EKG: Independently reviewed. Vent. rate 68 BPM PR interval * ms QRS duration 161 ms QT/QTc 427/455 ms P-R-T axes 32 255 86 Sinus rhythm Probable left atrial enlargement RBBB and  LAFB  Assessment/Plan Principal Problem:   AKI (acute kidney injury) (HCC) Observation/telemetry. Continue IV fluids. Monitor intake and output. Follow renal function electrolytes.  Active Problems:   COVID-19 virus infection Supplemental oxygen as needed. Bronchodilators as needed. Solu-Medrol every 12 hours. Remdesivir per pharmacy. Actemra contraindicated due to C. difficile. Antitussives as needed. Monitor CBC, CMP and inflammatory markers    C. difficile colitis Vancomycin oral 4 times a day. Likely the reason for positive Hemoccult.    Hyperkalemia Continue IV hydration plan on follow potassium level    ASVD Continue beta-blocker and aspirin when med reconciliation is done.    COPD (chronic obstructive pulmonary disease) (HCC) Supplemental oxygen as needed. Bronchodilators as needed.    Hypertension Continue atenolol once dose confirmed. Monitor blood pressure and heart rate.    Gout No active disease at this time.     DVT prophylaxis: SCDs. Code Status: Full code. Family Communication: Disposition Plan: Admit for AKI and COVID-19 virus infection treatment Consults called: Admission status: Inpatient/telemetry.   Bobette Moavid Manuel Malvika Tung MD [facility]  If 7PM-7AM, please contact night-coverage [email or web address]  03/19/2019, 8:41 PM   Sheria LangCameron was prepared using Conservation officer, historic buildingsDragon voice recognition software and may contain some unintended transcription errors.

## 2019-04-01 NOTE — ED Provider Notes (Signed)
This patient is presenting from skilled nursing facility after being tested positive for coronavirus 2 days ago.  He has been on 3 L by nasal cannula, presents with decreased level of consciousness and some congested lung sounds.  On my exam the patient has had a prior stroke, has some left-sided weakness and contractures, some facial droop, is able to follow otherwise simple commands.  His heart and lung exams are unremarkable, he is not in respiratory distress, unfortunately his lab work shows that he does have an acute kidney injury with a near doubling of his creatinine and an elevation of his potassium to about 6.  EKG pending.  The patient will need to be admitted for gentle hydration in the presence of Covid likely pneumonia.  CXR shows developing pna at the left lung base.  Jesus Lin was evaluated in Emergency Department on 05-Apr-2019 for the symptoms described in the history of present illness. He was evaluated in the context of the global COVID-19 pandemic, which necessitated consideration that the patient might be at risk for infection with the SARS-CoV-2 virus that causes COVID-19. Institutional protocols and algorithms that pertain to the evaluation of patients at risk for COVID-19 are in a state of rapid change based on information released by regulatory bodies including the CDC and federal and state organizations. These policies and algorithms were followed during the patient's care in the ED.    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 Confirmed by Noemi Chapel [phone]) on 04/05/19 7:19:07 PM       Medical screening examination/treatment/procedure(s) were conducted as a shared visit with non-physician practitioner(s) and  myself.  I personally evaluated the patient during the encounter.  Clinical Impression:   Final diagnoses:  [id number] virus infection         Noemi Chapel, MD 04/02/19 1029

## 2019-04-01 NOTE — ED Provider Notes (Signed)
[facility] EMERGENCY DEPARTMENT Provider Note   CSN: [id number] Arrival date & time: 08/22/2018  1653     History Chief Complaint  Patient presents with  . Weakness    Jesus Lin is a 73 y.o. male with a history of COPD, hypertension, CVA with left-sided hemiplegia, seizure disorder and history of alcohol abuse presenting from his local nursing home for increased weakness, decreased appetite and poor urine output for the past 24 hours.  He was diagnosed positive for COVID-19 2 days ago.  He denies any pain, shortness of breath, headache, myalgias, also no nausea vomiting or diarrhea, he simply states he is very fatigued.  No known fevers, he has had no antipyretics prior to arrival.  He has been on 3 L nasal cannula at the nursing facility and had a negative chest x-ray yesterday.  Patient is a full code.  The history is provided by the patient and the nursing home.       Past Medical History:  Diagnosis Date  . Alcohol abuse   . COPD (chronic obstructive pulmonary disease) (HCC)   . Gout   . Hypertension   . Seizures (HCC)   . Stroke [facility](HCC)     Patient Active Problem List   Diagnosis Date Noted  . AKI (acute kidney injury) (HCC) 05/21/202020  . Gangrene of toe of right foot (HCC) 10/22/2018  . ASVD (arteriosclerotic vascular disease) 10/22/2018  . Alcohol withdrawal seizure (HCC) 04/09/2013  . Other convulsions 12/16/2012  . Hemiplegia, unspecified, affecting nondominant side 12/16/2012  . Cocaine abuse (HCC) 12/16/2012  . Seizures (HCC)   . Tobacco abuse 08/26/2011  . History of gout 08/25/2011  . Acute ischemic right MCA stroke (HCC) 08/24/2011  . Alcohol abuse 08/24/2011    Past Surgical History:  Procedure Laterality Date  . PERIPHERAL VASCULAR CATHETERIZATION N/A 07/30/2015   Procedure: Abdominal Aortogram w/ Bilateral Lower Extremity Runoff;  Surgeon: Sherren Kernsharles E Fields, MD;  [facility];  Service: Cardiovascular;  Laterality: N/A;        History reviewed. No pertinent family history.  Social History   Tobacco Use  . Smoking status: Current Every Day Smoker    Packs/day: 1.00    Types: Cigarettes  . Smokeless tobacco: Never Used  Substance Use Topics  . Alcohol use: Yes    Alcohol/week: 0.0 standard drinks    Comment: liquor daily  . Drug use: No    Home Medications Prior to Admission medications   Medication Sig Start Date End Date Taking? Authorizing Provider  Amino Acids-Protein Hydrolys (FEEDING SUPPLEMENT, PRO-STAT SUGAR FREE 64,) LIQD Take 30 mLs by mouth 3 (three) times daily with meals.    [provider]  aspirin EC 81 MG tablet Take 81 mg by mouth daily.    [provider]  atenolol (TENORMIN) 25 MG tablet Take 25 mg by mouth daily.    [provider]  b complex-vitamin c-folic acid (NEPHRO-VITE) 0.8 MG TABS tablet Take 1 tablet by mouth at bedtime.    [provider]  hydrOXYzine (ATARAX/VISTARIL) 25 MG tablet Take 25 mg by mouth daily.    [provider]  levETIRAcetam (KEPPRA) 500 MG tablet Take 1 tablet (500 mg total) by mouth 2 (two) times daily. 12/16/12   Brandt LoosenManly, Connor Meacham, MD  mirtazapine (REMERON) 7.5 MG tablet Take 7.5 mg by mouth at bedtime.    [provider]  thiamine (VITAMIN B-1) 100 MG tablet Take 1 tablet (100 mg total) by mouth daily. 04/14/13  Rosita Fire, MD  traMADol (ULTRAM) 50 MG tablet Take 50 mg by mouth 2 (two) times daily.    [provider]  Vitamin D, Ergocalciferol, (DRISDOL) 50000 units CAPS capsule Take 50,000 Units by mouth every 30 (thirty) days.    [provider]    Allergies    Patient has no known allergies.  Review of Systems   Review of Systems  Constitutional: Positive for activity change, appetite change and fatigue. Negative for fever.  HENT: Negative for congestion and sore throat.   Eyes: Negative.   Respiratory: Negative for chest tightness and shortness of breath.    Cardiovascular: Negative for chest pain.  Gastrointestinal: Negative for abdominal pain and nausea.  Genitourinary: Negative.   Musculoskeletal: Negative for arthralgias, joint swelling and neck pain.  Skin: Negative.  Negative for rash and wound.  Neurological: Positive for weakness. Negative for headaches.  Psychiatric/Behavioral: Negative.     Physical Exam Updated Vital Signs BP (!) 83/59   Pulse 66   Temp 98.5 F (36.9 C) (Oral)   Resp 16   Ht 5\' 10"  (1.778 m)   Wt 63.5 kg   SpO2 95%   BMI 20.09 kg/m   Physical Exam Vitals and nursing note reviewed.  Constitutional:      Appearance: He is well-developed.  HENT:     Head: Normocephalic and atraumatic.     Mouth/Throat:     Mouth: Mucous membranes are dry.  Eyes:     Conjunctiva/sclera: Conjunctivae normal.  Cardiovascular:     Rate and Rhythm: Normal rate and regular rhythm.     Heart sounds: Normal heart sounds.  Pulmonary:     Effort: No respiratory distress.     Breath sounds: Normal breath sounds. No wheezing.     Comments: Reduced breath sounds but poor effort.  Abdominal:     General: Bowel sounds are normal. There is no distension.     Palpations: Abdomen is soft.     Tenderness: There is no abdominal tenderness. There is no guarding.  Musculoskeletal:        General: Normal range of motion.     Cervical back: Normal range of motion.  Skin:    General: Skin is warm and dry.  Neurological:     Mental Status: He is alert and oriented to person, place, and time.     Comments: Left sided weakness upper and lower extremities.     ED Results / Procedures / Treatments   Labs (all labs ordered are listed, but only abnormal results are displayed) Labs Reviewed  URINALYSIS, ROUTINE W REFLEX MICROSCOPIC - Abnormal; Notable for the following components:      Result Value   Color, Urine AMBER (*)    APPearance CLOUDY (*)    Hgb urine dipstick LARGE (*)    Protein, ur 100 (*)    RBC / HPF >50 (*)     Bacteria, UA RARE (*)    Non Squamous Epithelial 0-5 (*)    All other components within normal limits  CBC WITH DIFFERENTIAL/PLATELET - Abnormal; Notable for the following components:   WBC 15.1 (*)    MCH 25.5 (*)    Neutro Abs 13.6 (*)    Abs Immature Granulocytes 0.08 (*)    All other components within normal limits  COMPREHENSIVE METABOLIC PANEL - Abnormal; Notable for the following components:   Potassium 6.1 (*)    BUN 33 (*)    Creatinine, Ser 2.02 (*)    Calcium 8.6 (*)  Total Protein 8.2 (*)    Albumin 3.2 (*)    AST 79 (*)    ALT 68 (*)    Alkaline Phosphatase 127 (*)    GFR calc non Af Amer 32 (*)    GFR calc Af Amer 37 (*)    All other components within normal limits  URINE CULTURE  SARS CORONAVIRUS 2 (TAT 6-24 HRS)  CK  PHOSPHORUS  PROCALCITONIN  PROCALCITONIN    EKG EKG Interpretation  Date/Time:  Tuesday April 01 2019 19:04:24 EST Ventricular Rate:  68 PR Interval:    QRS Duration: 161 QT Interval:  427 QTC Calculation: 455 R Axis:   -105 Text Interpretation: Sinus rhythm Probable left atrial enlargement RBBB and LAFB Since last tracing BBB is new. Confirmed by Eber Hong ([id number]) on 03/07/2019 7:19:07 PM   Radiology DG Chest Portable 1 View  Result Date: 03/21/2019 CLINICAL DATA:  Cough history of COVID. EXAM: PORTABLE CHEST 1 VIEW COMPARISON:  04/09/2013 FINDINGS: Cardiomediastinal contours are stable with along the a shin of the heart. Lungs are hyperinflated as before. Likely with some background of pulmonary emphysema. Added interstitial prominence in the left lung base is noted on today's exam with some mild blunting of the left costodiaphragmatic sulcus. Right lung is clear. No acute bone process. IMPRESSION: 1. Findings suspicious for developing pneumonia in the left lung base. 2. Probable concomitant small left effusion. 3. COPD. Electronically Signed   By: Donzetta Kohut M.D.   On: 03/04/2019 18:29    Procedures Procedures (including  critical care time)  Medications Ordered in ED Medications  0.9 %  sodium chloride infusion (has no administration in time range)  cefTRIAXone (ROCEPHIN) 1 g in sodium chloride 0.9 % 100 mL IVPB (has no administration in time range)  sodium chloride 0.9 % bolus 250 mL (0 mLs Intravenous Stopped 03/05/2019 1907)    ED Course  I have reviewed the triage vital signs and the nursing notes.  Pertinent labs & imaging results that were available during my care of the patient were reviewed by me and considered in my medical decision making (see chart for details).    MDM Rules/Calculators/A&P                      Patient presenting from a local nursing home, known Covid positive, reduced urination and increased weakness over the past 24 hours.  Labs revealing for acute renal failure with a modestly elevated potassium level.  No EKG changes.  Additionally has leukocytosis and elevation in his LFTs.  No complaint of abdominal pain.  He reports just mild shortness of breath but he has been on 3 L of nasal cannula at the nursing home.  He will require admission.  He was given gentle IV fluids here.   Discussed with Dr. Robb Matar who accepts pt for admission.  Requested documentation of Covid+ results. Secretary to call for faxed results.     Final Clinical Impression(s) / ED Diagnoses Final diagnoses:  COVID-19 virus infection  Weakness  Acute renal failure, unspecified acute renal failure type (HCC)  Hyperkalemia  Acute cystitis with hematuria    Rx / DC Orders ED Discharge Orders    None       Victoriano Lain 04/02/2019 1932    Eber Hong, MD 04/02/19 1029

## 2019-04-01 NOTE — ED Notes (Signed)
[facility] faxed copy of Positive Covid 19 Result. Test was Resulted on 03/30/19.

## 2019-04-01 NOTE — ED Triage Notes (Signed)
[facility] brought pt from [facility]. Nursing at [facility] called and gave report prior to patient's arrival. PT tested positive for Covid on 03/30/19 and since then has been on 3L n/c at nursing facility. They reported a mobile xray was negative yesterday but during the evening and into today patient has been lethargic, decreased urinary output, not ate or drank fluids today. Nurse reported pt is a long-term resident of [facility] and is normally alert and oriented. PT alert and oriented upon arrival to ED today and sats 97% on 2L n/c. Lungs sound congested and incontinence brief wet with urine upon arrival.

## 2019-04-02 DIAGNOSIS — J1289 Other viral pneumonia: Secondary | ICD-10-CM | POA: Diagnosis not present

## 2019-04-02 DIAGNOSIS — I452 Bifascicular block: Secondary | ICD-10-CM | POA: Diagnosis present

## 2019-04-02 DIAGNOSIS — G40909 Epilepsy, unspecified, not intractable, without status epilepticus: Secondary | ICD-10-CM | POA: Diagnosis present

## 2019-04-02 DIAGNOSIS — K920 Hematemesis: Secondary | ICD-10-CM | POA: Diagnosis not present

## 2019-04-02 DIAGNOSIS — U071 COVID-19: Principal | ICD-10-CM

## 2019-04-02 DIAGNOSIS — L89152 Pressure ulcer of sacral region, stage 2: Secondary | ICD-10-CM | POA: Diagnosis present

## 2019-04-02 DIAGNOSIS — A0472 Enterocolitis due to Clostridium difficile, not specified as recurrent: Secondary | ICD-10-CM | POA: Diagnosis present

## 2019-04-02 DIAGNOSIS — E46 Unspecified protein-calorie malnutrition: Secondary | ICD-10-CM | POA: Diagnosis present

## 2019-04-02 DIAGNOSIS — J9601 Acute respiratory failure with hypoxia: Secondary | ICD-10-CM | POA: Diagnosis present

## 2019-04-02 DIAGNOSIS — I469 Cardiac arrest, cause unspecified: Secondary | ICD-10-CM | POA: Diagnosis not present

## 2019-04-02 DIAGNOSIS — I10 Essential (primary) hypertension: Secondary | ICD-10-CM | POA: Diagnosis present

## 2019-04-02 DIAGNOSIS — F17210 Nicotine dependence, cigarettes, uncomplicated: Secondary | ICD-10-CM | POA: Diagnosis present

## 2019-04-02 DIAGNOSIS — E875 Hyperkalemia: Secondary | ICD-10-CM

## 2019-04-02 DIAGNOSIS — F1011 Alcohol abuse, in remission: Secondary | ICD-10-CM | POA: Diagnosis present

## 2019-04-02 DIAGNOSIS — R7401 Elevation of levels of liver transaminase levels: Secondary | ICD-10-CM | POA: Diagnosis present

## 2019-04-02 DIAGNOSIS — J690 Pneumonitis due to inhalation of food and vomit: Secondary | ICD-10-CM | POA: Diagnosis not present

## 2019-04-02 DIAGNOSIS — I70209 Unspecified atherosclerosis of native arteries of extremities, unspecified extremity: Secondary | ICD-10-CM | POA: Diagnosis present

## 2019-04-02 DIAGNOSIS — L89894 Pressure ulcer of other site, stage 4: Secondary | ICD-10-CM | POA: Diagnosis present

## 2019-04-02 DIAGNOSIS — J1282 Pneumonia due to coronavirus disease 2019: Secondary | ICD-10-CM | POA: Diagnosis present

## 2019-04-02 DIAGNOSIS — M109 Gout, unspecified: Secondary | ICD-10-CM | POA: Diagnosis present

## 2019-04-02 DIAGNOSIS — J440 Chronic obstructive pulmonary disease with acute lower respiratory infection: Secondary | ICD-10-CM | POA: Diagnosis present

## 2019-04-02 DIAGNOSIS — E860 Dehydration: Secondary | ICD-10-CM | POA: Diagnosis present

## 2019-04-02 DIAGNOSIS — F1411 Cocaine abuse, in remission: Secondary | ICD-10-CM | POA: Diagnosis present

## 2019-04-02 DIAGNOSIS — I69354 Hemiplegia and hemiparesis following cerebral infarction affecting left non-dominant side: Secondary | ICD-10-CM | POA: Diagnosis not present

## 2019-04-02 DIAGNOSIS — N3001 Acute cystitis with hematuria: Secondary | ICD-10-CM | POA: Diagnosis present

## 2019-04-02 DIAGNOSIS — N179 Acute kidney failure, unspecified: Secondary | ICD-10-CM | POA: Diagnosis present

## 2019-04-02 LAB — COMPREHENSIVE METABOLIC PANEL
ALT: 48 U/L — ABNORMAL HIGH (ref 0–44)
AST: 46 U/L — ABNORMAL HIGH (ref 15–41)
Albumin: 2.5 g/dL — ABNORMAL LOW (ref 3.5–5.0)
Alkaline Phosphatase: 96 U/L (ref 38–126)
Anion gap: 12 (ref 5–15)
BUN: 30 mg/dL — ABNORMAL HIGH (ref 8–23)
CO2: 19 mmol/L — ABNORMAL LOW (ref 22–32)
Calcium: 8.2 mg/dL — ABNORMAL LOW (ref 8.9–10.3)
Chloride: 110 mmol/L (ref 98–111)
Creatinine, Ser: 1.56 mg/dL — ABNORMAL HIGH (ref 0.61–1.24)
GFR calc Af Amer: 51 mL/min — ABNORMAL LOW (ref >60)
GFR calc non Af Amer: 44 mL/min — ABNORMAL LOW (ref >60)
Glucose, Bld: 251 mg/dL — ABNORMAL HIGH (ref 70–99)
Potassium: 5.6 mmol/L — ABNORMAL HIGH (ref 3.5–5.1)
Sodium: 141 mmol/L (ref 135–145)
Total Bilirubin: 0.5 mg/dL (ref 0.3–1.2)
Total Protein: 6.3 g/dL — ABNORMAL LOW (ref 6.5–8.1)

## 2019-04-02 LAB — CBC WITH DIFFERENTIAL/PLATELET
Abs Immature Granulocytes: 0.03 10*3/uL (ref 0.00–0.07)
Basophils Absolute: 0 10*3/uL (ref 0.0–0.1)
Basophils Relative: 0 %
Eosinophils Absolute: 0 10*3/uL (ref 0.0–0.5)
Eosinophils Relative: 0 %
HCT: 37.9 % — ABNORMAL LOW (ref 39.0–52.0)
Hemoglobin: 11.5 g/dL — ABNORMAL LOW (ref 13.0–17.0)
Immature Granulocytes: 0 %
Lymphocytes Relative: 9 %
Lymphs Abs: 0.8 10*3/uL (ref 0.7–4.0)
MCH: 25.8 pg — ABNORMAL LOW (ref 26.0–34.0)
MCHC: 30.3 g/dL (ref 30.0–36.0)
MCV: 85.2 fL (ref 80.0–100.0)
Monocytes Absolute: 0.7 10*3/uL (ref 0.1–1.0)
Monocytes Relative: 7 %
Neutro Abs: 7.5 10*3/uL (ref 1.7–7.7)
Neutrophils Relative %: 84 %
Platelets: 171 10*3/uL (ref 150–400)
RBC: 4.45 MIL/uL (ref 4.22–5.81)
RDW: 14.3 % (ref 11.5–15.5)
WBC: 9 10*3/uL (ref 4.0–10.5)
nRBC: 0 % (ref 0.0–0.2)

## 2019-04-02 LAB — SARS CORONAVIRUS 2 (TAT 6-24 HRS): SARS Coronavirus 2: POSITIVE — AB

## 2019-04-02 LAB — MRSA PCR SCREENING: MRSA by PCR: NEGATIVE

## 2019-04-02 LAB — D-DIMER, QUANTITATIVE: D-Dimer, Quant: 1.67 ug/mL-FEU — ABNORMAL HIGH (ref 0.00–0.50)

## 2019-04-02 LAB — C DIFFICILE QUICK SCREEN W PCR REFLEX
C Diff antigen: POSITIVE — AB
C Diff toxin: NEGATIVE

## 2019-04-02 LAB — C-REACTIVE PROTEIN: CRP: 19.9 mg/dL — ABNORMAL HIGH (ref <1.0)

## 2019-04-02 LAB — INFLUENZA PANEL BY PCR (TYPE A & B)
Influenza A By PCR: NEGATIVE
Influenza B By PCR: NEGATIVE

## 2019-04-02 LAB — PROCALCITONIN: Procalcitonin: 1.68 ng/mL

## 2019-04-02 LAB — CLOSTRIDIUM DIFFICILE BY PCR, REFLEXED: Toxigenic C. Difficile by PCR: NEGATIVE

## 2019-04-02 LAB — FERRITIN: Ferritin: 320 ng/mL (ref 24–336)

## 2019-04-02 LAB — MAGNESIUM: Magnesium: 1.7 mg/dL (ref 1.7–2.4)

## 2019-04-02 LAB — OCCULT BLOOD, POC DEVICE: Fecal Occult Bld: POSITIVE — AB

## 2019-04-02 LAB — PHOSPHORUS: Phosphorus: 3.2 mg/dL (ref 2.5–4.6)

## 2019-04-02 MED ORDER — SODIUM CHLORIDE 0.9 % IV SOLN
200.0000 mg | Freq: Once | INTRAVENOUS | Status: AC
  Administered 2019-04-02: 200 mg via INTRAVENOUS
  Filled 2019-04-02: qty 40

## 2019-04-02 MED ORDER — HEPARIN SODIUM (PORCINE) 5000 UNIT/ML IJ SOLN
5000.0000 [IU] | Freq: Three times a day (TID) | INTRAMUSCULAR | Status: DC
  Administered 2019-04-02 – 2019-04-06 (×11): 5000 [IU] via SUBCUTANEOUS
  Filled 2019-04-02 (×10): qty 1

## 2019-04-02 MED ORDER — SODIUM CHLORIDE 0.9 % IV SOLN: INTRAVENOUS | Status: DC

## 2019-04-02 MED ORDER — METHYLPREDNISOLONE SODIUM SUCC 40 MG IJ SOLR
30.0000 mg | Freq: Two times a day (BID) | INTRAMUSCULAR | Status: DC
  Administered 2019-04-02 – 2019-04-05 (×7): 30 mg via INTRAVENOUS
  Filled 2019-04-02 (×8): qty 1

## 2019-04-02 MED ORDER — SODIUM CHLORIDE 0.9 % IV SOLN: 2.0000 g | INTRAVENOUS | Status: DC

## 2019-04-02 MED ORDER — SODIUM CHLORIDE 0.9 % IV SOLN
100.0000 mg | Freq: Every day | INTRAVENOUS | Status: DC
  Administered 2019-04-03 – 2019-04-05 (×3): 100 mg via INTRAVENOUS
  Filled 2019-04-02 (×2): qty 20
  Filled 2019-04-02 (×2): qty 100

## 2019-04-02 MED ORDER — METHYLPREDNISOLONE SODIUM SUCC 125 MG IJ SOLR
65.0000 mg | Freq: Two times a day (BID) | INTRAMUSCULAR | Status: DC
  Administered 2019-04-02: 10:00:00 65 mg via INTRAVENOUS
  Filled 2019-04-02: qty 2

## 2019-04-02 MED ORDER — VANCOMYCIN 50 MG/ML ORAL SOLUTION
125.0000 mg | Freq: Four times a day (QID) | ORAL | Status: DC
  Administered 2019-04-02 (×2): 125 mg via ORAL
  Filled 2019-04-02 (×10): qty 2.5

## 2019-04-02 MED ORDER — SODIUM CHLORIDE 0.9 % IV SOLN
500.0000 mg | INTRAVENOUS | Status: DC
  Administered 2019-04-02: 06:00:00 500 mg via INTRAVENOUS
  Filled 2019-04-02: qty 500

## 2019-04-02 NOTE — Progress Notes (Signed)
Has not been interested in eating.  Daughter said to offer him jello or pudding and after one bite of each said he didn't want any more.  Had one loose stool today

## 2019-04-02 NOTE — Progress Notes (Signed)
PROGRESS NOTE    Jesus Lin  YTK:[id number] DOB: [birth year] DOA: 03/07/2019 PCP: Rosita Fire, MD    Brief Narrative:  73 year old male with a history of alcohol abuse, COPD, gout, hypertension, resident of a skilled nursing facility, referred to the hospital with a new oxygen requirement of 3 L.  ED was becoming increasingly lethargic and had decreased urine output.  In the emergency room, chest x-ray indicated possible developing pneumonia.  Testing was positive for COVID-19.  Also noted to have acute kidney injury and hyperkalemia.  He was admitted for further treatment.   Assessment & Plan:   Principal Problem:   AKI (acute kidney injury) ([place]) Active Problems:   ASVD (arteriosclerotic vascular disease)   COPD (chronic obstructive pulmonary disease) (HCC)   Hypertension   Gout   Hyperkalemia   COVID-19 virus infection   C. difficile colitis   Transaminitis   Pneumonia due to COVID-19 virus   1. Acute kidney injury.  Felt to be secondary to dehydration.  Improved with IV fluids.  Continue current treatments and monitor urine output. 2. COVID-19 virus pneumonia.  Patient noted to have suspicious findings of left lung base pneumonia.  He has been started on intravenous steroids and remdesivir. 3. Acute respiratory failure with hypoxia.  Initially requiring 2 L of oxygen.  Has been weaned down. 4. Loose stool.  Patient C. difficile test that returned positive for infection, negative for toxin and negative for PCR.  Currently, he only had 1 stool today.  Suspect that he is colonized with C. difficile.  Would not treat with vancomycin at this time. 5. Hyperkalemia.  Secondary to dehydration and acute kidney injury.  This is improving with IV hydration. 6. COPD.  Continue bronchodilators as needed.  No wheezing at this time. 7. Hypertension.  Continue on atenolol.   DVT prophylaxis: Heparin Code Status: Full code Family Communication: None present Disposition Plan:  Discharge to skilled nursing facility once remdesivir course has been completed   Consultants:     Procedures:     Antimicrobials:       Subjective: Denies any shortness of breath.  No nausea or vomiting.  Staff reports a 7 pound stool today.  Objective: Vitals:   04/02/19 0309 04/02/19 0430 04/02/19 0559 04/02/19 1416  BP: (!) 93/57 94/74 124/74 121/69  Pulse: 60  63 70  Resp: 17 17 20 18   Temp:   (!) 97.4 F (36.3 C) 98.1 F (36.7 C)  TempSrc:   Oral Oral  SpO2: 98% 98% 100% 98%  Weight:      Height:        Intake/Output Summary (Last 24 hours) at 04/02/2019 1919 Last data filed at 04/02/2019 1700 Gross per 24 hour  Intake 3102.41 ml  Output --  Net 3102.41 ml   Filed Weights   03/05/2019 1705  Weight: 63.5 kg    Examination:  General exam: Appears calm and comfortable  Respiratory system: Clear to auscultation. Respiratory effort normal. Cardiovascular system: S1 & S2 heard, RRR. No JVD, murmurs, rubs, gallops or clicks.  Gastrointestinal system: Abdomen is nondistended, soft and nontender. No organomegaly or masses felt. Normal bowel sounds heard. Central nervous system: Left arm contracture.  Limited exam due to patient participation. Extremities: No edema bilaterally Skin: No rashes, lesions or ulcers Psychiatry: Does not engage in conversation    Data Reviewed: I have personally reviewed following labs and imaging studies  CBC: Recent Labs  Lab 03/21/2019 1734 04/02/19 0406  WBC 15.1* 9.0  NEUTROABS  13.6* 7.5  HGB 13.0 11.5*  HCT 42.4 37.9*  MCV 83.1 85.2  PLT 239 171   Basic Metabolic Panel: Recent Labs  Lab 04-04-2019 1734 04/02/19 0406  NA 140 141  K 6.1* 5.6*  CL 108 110  CO2 22 19*  GLUCOSE 92 251*  BUN 33* 30*  CREATININE 2.02* 1.56*  CALCIUM 8.6* 8.2*  MG  --  1.7  PHOS 3.1 3.2   GFR: Estimated Creatinine Clearance: 38.4 mL/min (A) (by C-G formula based on SCr of 1.56 mg/dL (H)). Liver Function Tests: Recent Labs   Lab 04/04/2019 1734 04/02/19 0406  AST 79* 46*  ALT 68* 48*  ALKPHOS 127* 96  BILITOT 0.8 0.5  PROT 8.2* 6.3*  ALBUMIN 3.2* 2.5*   No results for input(s): LIPASE, AMYLASE in the last 168 hours. No results for input(s): AMMONIA in the last 168 hours. Coagulation Profile: No results for input(s): INR, PROTIME in the last 168 hours. Cardiac Enzymes: Recent Labs  Lab 2019-04-04 1734  CKTOTAL 53   BNP (last 3 results) No results for input(s): PROBNP in the last 8760 hours. HbA1C: No results for input(s): HGBA1C in the last 72 hours. CBG: No results for input(s): GLUCAP in the last 168 hours. Lipid Profile: No results for input(s): CHOL, HDL, LDLCALC, TRIG, CHOLHDL, LDLDIRECT in the last 72 hours. Thyroid Function Tests: No results for input(s): TSH, T4TOTAL, FREET4, T3FREE, THYROIDAB in the last 72 hours. Anemia Panel: Recent Labs    2019/04/04 1734 04/02/19 0406  FERRITIN 383* 320   Sepsis Labs: Recent Labs  Lab 04-04-2019 1734 04/02/19 0406  PROCALCITON 4.07 1.68    Recent Results (from the past 240 hour(s))  SARS CORONAVIRUS 2 (TAT 6-24 HRS) Nasopharyngeal Nasopharyngeal Swab     Status: Abnormal   Collection Time: 04-04-2019  7:33 PM   Specimen: Nasopharyngeal Swab  Result Value Ref Range Status   SARS Coronavirus 2 POSITIVE (A) NEGATIVE Final    Comment: emailed L. Berdik RN 14:15 04/02/19 ([email or web address]) (NOTE) SARS-CoV-2 target nucleic acids are DETECTED. The SARS-CoV-2 RNA is generally detectable in upper and lower respiratory specimens during the acute phase of infection. Positive results are indicative of the presence of SARS-CoV-2 RNA. Clinical correlation with patient history and other diagnostic information is  necessary to determine patient infection status. Positive results do not rule out bacterial infection or co-infection with other viruses.  The expected result is Negative. Fact Sheet for Patients: HairSlick.no Fact Sheet  for Healthcare Providers: quierodirigir.com This test is not yet approved or cleared by the [place] FDA and  has been authorized for detection and/or diagnosis of SARS-CoV-2 by FDA under an Emergency Use Authorization (EUA). This EUA will remain  in effect (meaning this test can be used) for the duration of the COVID-19 declaration un der Section 564(b)(1) of the Act, 21 U.S.C. section 360bbb-3(b)(1), unless the authorization is terminated or revoked sooner. Performed at [facility], [address], [place], [place] [address]   C difficile quick scan w PCR reflex     Status: Abnormal   Collection Time: 04/02/19  5:49 AM   Specimen: STOOL  Result Value Ref Range Status   C Diff antigen POSITIVE (A) NEGATIVE Final   C Diff toxin NEGATIVE NEGATIVE Final   C Diff interpretation Results are indeterminate. See PCR results.  Final    Comment: Performed at [facility], [address] [place], [place] [address]  C. Diff by PCR, Reflexed     Status: None  Collection Time: 04/02/19  5:49 AM  Result Value Ref Range Status   Toxigenic C. Difficile by PCR NEGATIVE NEGATIVE Final    Comment: Patient is colonized with non toxigenic C. difficile. May not need treatment unless significant symptoms are present. Performed at [facility], [address] [place], [place] [address]   MRSA PCR Screening     Status: None   Collection Time: 04/02/19  1:10 PM   Specimen: Nasopharyngeal  Result Value Ref Range Status   MRSA by PCR NEGATIVE NEGATIVE Final    Comment:        The GeneXpert MRSA Assay (FDA approved for NASAL specimens only), is one component of a comprehensive MRSA colonization surveillance program. It is not intended to diagnose MRSA infection nor to guide or monitor treatment for MRSA infections. Performed at [facility], [address] [place], [place] [address]          Radiology Studies: DG Chest Portable 1 View  Result Date:  03/26/2019 CLINICAL DATA:  Cough history of COVID. EXAM: PORTABLE CHEST 1 VIEW COMPARISON:  04/09/2013 FINDINGS: Cardiomediastinal contours are stable with along the a shin of the heart. Lungs are hyperinflated as before. Likely with some background of pulmonary emphysema. Added interstitial prominence in the left lung base is noted on today's exam with some mild blunting of the left costodiaphragmatic sulcus. Right lung is clear. No acute bone process. IMPRESSION: 1. Findings suspicious for developing pneumonia in the left lung base. 2. Probable concomitant small left effusion. 3. COPD. Electronically Signed   By: Donzetta KohutGeoffrey  Wile M.D.   On: 04/03/2019 18:29        Scheduled Meds: . vitamin C  500 mg Oral Daily  . methylPREDNISolone (SOLU-MEDROL) injection  65 mg Intravenous Q12H  . vancomycin  125 mg Oral QID  . zinc sulfate  220 mg Oral Daily   Continuous Infusions: . sodium chloride 75 mL/hr at 04/02/19 0941  . [START ON 04/03/2019] remdesivir 100 mg in NS 100 mL       LOS: 0 days    Time spent: 35mins    Erick BlinksJehanzeb Oberia Beaudoin, MD [facility]   If 7PM-7AM, please contact night-coverage [email or web address]  04/02/2019, 7:19 PM

## 2019-04-03 LAB — COMPREHENSIVE METABOLIC PANEL
ALT: 54 U/L — ABNORMAL HIGH (ref 0–44)
AST: 59 U/L — ABNORMAL HIGH (ref 15–41)
Albumin: 2.5 g/dL — ABNORMAL LOW (ref 3.5–5.0)
Alkaline Phosphatase: 104 U/L (ref 38–126)
Anion gap: 12 (ref 5–15)
BUN: 28 mg/dL — ABNORMAL HIGH (ref 8–23)
CO2: 19 mmol/L — ABNORMAL LOW (ref 22–32)
Calcium: 8.6 mg/dL — ABNORMAL LOW (ref 8.9–10.3)
Chloride: 110 mmol/L (ref 98–111)
Creatinine, Ser: 1.13 mg/dL (ref 0.61–1.24)
GFR calc Af Amer: >60 mL/min (ref >60)
GFR calc non Af Amer: >60 mL/min (ref >60)
Glucose, Bld: 98 mg/dL (ref 70–99)
Potassium: 6 mmol/L — ABNORMAL HIGH (ref 3.5–5.1)
Sodium: 141 mmol/L (ref 135–145)
Total Bilirubin: 0.5 mg/dL (ref 0.3–1.2)
Total Protein: 6.8 g/dL (ref 6.5–8.1)

## 2019-04-03 LAB — CBC WITH DIFFERENTIAL/PLATELET
Abs Immature Granulocytes: 0.02 10*3/uL (ref 0.00–0.07)
Basophils Absolute: 0 10*3/uL (ref 0.0–0.1)
Basophils Relative: 0 %
Eosinophils Absolute: 0 10*3/uL (ref 0.0–0.5)
Eosinophils Relative: 0 %
HCT: 37.2 % — ABNORMAL LOW (ref 39.0–52.0)
Hemoglobin: 11.4 g/dL — ABNORMAL LOW (ref 13.0–17.0)
Immature Granulocytes: 1 %
Lymphocytes Relative: 12 %
Lymphs Abs: 0.5 10*3/uL — ABNORMAL LOW (ref 0.7–4.0)
MCH: 25.7 pg — ABNORMAL LOW (ref 26.0–34.0)
MCHC: 30.6 g/dL (ref 30.0–36.0)
MCV: 83.8 fL (ref 80.0–100.0)
Monocytes Absolute: 0.1 10*3/uL (ref 0.1–1.0)
Monocytes Relative: 2 %
Neutro Abs: 3.3 10*3/uL (ref 1.7–7.7)
Neutrophils Relative %: 85 %
Platelets: 194 10*3/uL (ref 150–400)
RBC: 4.44 MIL/uL (ref 4.22–5.81)
RDW: 14.4 % (ref 11.5–15.5)
WBC: 3.9 10*3/uL — ABNORMAL LOW (ref 4.0–10.5)
nRBC: 0 % (ref 0.0–0.2)

## 2019-04-03 LAB — C-REACTIVE PROTEIN: CRP: 18.2 mg/dL — ABNORMAL HIGH (ref <1.0)

## 2019-04-03 LAB — URINE CULTURE: Culture: 10000 — AB

## 2019-04-03 LAB — PHOSPHORUS: Phosphorus: 3.7 mg/dL (ref 2.5–4.6)

## 2019-04-03 LAB — D-DIMER, QUANTITATIVE: D-Dimer, Quant: 1.24 ug/mL-FEU — ABNORMAL HIGH (ref 0.00–0.50)

## 2019-04-03 LAB — MAGNESIUM: Magnesium: 1.7 mg/dL (ref 1.7–2.4)

## 2019-04-03 LAB — FERRITIN: Ferritin: 402 ng/mL — ABNORMAL HIGH (ref 24–336)

## 2019-04-03 MED ORDER — SODIUM ZIRCONIUM CYCLOSILICATE 10 G PO PACK
10.0000 g | PACK | ORAL | Status: AC
  Administered 2019-04-03: 21:00:00 10 g via ORAL

## 2019-04-03 NOTE — Progress Notes (Signed)
PROGRESS NOTE    Jesus Lin  NIO:[id number] DOB: Nov 12, 1945 DOA: 04/15/2019 PCP: Avon Gully, MD    Brief Narrative:  73 year old male with a history of alcohol abuse, COPD, gout, hypertension, resident of a skilled nursing facility, referred to the hospital with a new oxygen requirement of 3 L.  ED was becoming increasingly lethargic and had decreased urine output.  In the emergency room, chest x-ray indicated possible developing pneumonia.  Testing was positive for COVID-19.  Also noted to have acute kidney injury and hyperkalemia.  He was admitted for further treatment.   Assessment & Plan:   Principal Problem:   AKI (acute kidney injury) (HCC) Active Problems:   ASVD (arteriosclerotic vascular disease)   COPD (chronic obstructive pulmonary disease) (HCC)   Hypertension   Gout   Hyperkalemia   COVID-19 virus infection   C. difficile colitis   Transaminitis   Pneumonia due to COVID-19 virus   1. Acute kidney injury.  Felt to be secondary to dehydration.  Improved with IV fluids.  Continue current treatments and monitor urine output. 2. COVID-19 virus pneumonia.  Patient noted to have suspicious findings of left lung base pneumonia.  He has been started on intravenous steroids and remdesivir. 3. Acute respiratory failure with hypoxia.  Initially requiring 2 L of oxygen.  Has been weaned down. 4. Loose stool.  Patient C. difficile test that returned positive for infection, negative for toxin and negative for PCR.  Currently, he only had 1 stool today.  Suspect that he is colonized with C. difficile.  Would not treat with vancomycin at this time. 5. Hyperkalemia.  Secondary to dehydration and acute kidney injury.  He received IV fluids, and renal function has improved, but potassium remains elevated.  Give 1 dose of Lokelma. 6. COPD.  Continue bronchodilators as needed.  No wheezing at this time. 7. Hypertension.  Continue on atenolol.   DVT prophylaxis: Heparin Code  Status: Full code Family Communication: None present Disposition Plan: Discharge to skilled nursing facility once remdesivir course has been completed   Consultants:     Procedures:     Antimicrobials:       Subjective: Patient has not had any loose stools today.  No shortness of breath or cough.  Appetite has been fair.  Objective: Vitals:   04/02/19 0559 04/02/19 1416 04/02/19 2223 04/03/19 0515  BP: 124/74 121/69 101/63 106/64  Pulse: 63 70 (!) 53 (!) 53  Resp: 20 18 18 20   Temp: (!) 97.4 F (36.3 C) 98.1 F (36.7 C) (!) 97.5 F (36.4 C) (!) 97.5 F (36.4 C)  TempSrc: Oral Oral Oral Axillary  SpO2: 100% 98% (!) 59% 98%  Weight:      Height:        Intake/Output Summary (Last 24 hours) at 04/03/2019 2017 Last data filed at 04/03/2019 0700 Gross per 24 hour  Intake --  Output 675 ml  Net -675 ml   Filed Weights   04/15/2019 1705  Weight: 63.5 kg    Examination:  General exam: Alert, awake, oriented x 3 Respiratory system: Clear to auscultation. Respiratory effort normal. Cardiovascular system:RRR. No murmurs, rubs, gallops. Gastrointestinal system: Abdomen is nondistended, soft and nontender. No organomegaly or masses felt. Normal bowel sounds heard. Central nervous system: Alert and oriented.  Spastic left hemiparesis Extremities: No C/C/E, +pedal pulses Skin: No rashes, lesions or ulcers Psychiatry: Judgement and insight appear normal. Mood & affect appropriate.      Data Reviewed: I have personally reviewed following  labs and imaging studies  CBC: Recent Labs  Lab 02-Jan-2019 1734 04/02/19 0406 04/03/19 0653  WBC 15.1* 9.0 3.9*  NEUTROABS 13.6* 7.5 3.3  HGB 13.0 11.5* 11.4*  HCT 42.4 37.9* 37.2*  MCV 83.1 85.2 83.8  PLT 239 171 194   Basic Metabolic Panel: Recent Labs  Lab 02-Jan-2019 1734 04/02/19 0406 04/03/19 0653  NA 140 141 141  K 6.1* 5.6* 6.0*  CL 108 110 110  CO2 22 19* 19*  GLUCOSE 92 251* 98  BUN 33* 30* 28*  CREATININE  2.02* 1.56* 1.13  CALCIUM 8.6* 8.2* 8.6*  MG  --  1.7 1.7  PHOS 3.1 3.2 3.7   GFR: Estimated Creatinine Clearance: 53.1 mL/min (by C-G formula based on SCr of 1.13 mg/dL). Liver Function Tests: Recent Labs  Lab 02-Jan-2019 1734 04/02/19 0406 04/03/19 0653  AST 79* 46* 59*  ALT 68* 48* 54*  ALKPHOS 127* 96 104  BILITOT 0.8 0.5 0.5  PROT 8.2* 6.3* 6.8  ALBUMIN 3.2* 2.5* 2.5*   No results for input(s): LIPASE, AMYLASE in the last 168 hours. No results for input(s): AMMONIA in the last 168 hours. Coagulation Profile: No results for input(s): INR, PROTIME in the last 168 hours. Cardiac Enzymes: Recent Labs  Lab 02-Jan-2019 1734  CKTOTAL 53   BNP (last 3 results) No results for input(s): PROBNP in the last 8760 hours. HbA1C: No results for input(s): HGBA1C in the last 72 hours. CBG: No results for input(s): GLUCAP in the last 168 hours. Lipid Profile: No results for input(s): CHOL, HDL, LDLCALC, TRIG, CHOLHDL, LDLDIRECT in the last 72 hours. Thyroid Function Tests: No results for input(s): TSH, T4TOTAL, FREET4, T3FREE, THYROIDAB in the last 72 hours. Anemia Panel: Recent Labs    04/02/19 0406 04/03/19 0653  FERRITIN 320 402*   Sepsis Labs: Recent Labs  Lab 02-Jan-2019 1734 04/02/19 0406  PROCALCITON 4.07 1.68    Recent Results (from the past 240 hour(s))  Urine culture     Status: Abnormal   Collection Time: 02-Jan-2019  5:45 PM   Specimen: Urine, Clean Catch  Result Value Ref Range Status   Specimen Description   Final    URINE, CLEAN CATCH Performed at [facility], [address] [place], [place] [address]    Special Requests   Final    NONE Performed at [facility], [address] [place], [place] [address]    Culture (A)  Final    10,000 COLONIES/mL MULTIPLE SPECIES PRESENT, SUGGEST RECOLLECTION   Report Status 04/03/2019 FINAL  Final  SARS CORONAVIRUS 2 (TAT 6-24 HRS) Nasopharyngeal Nasopharyngeal Swab     Status: Abnormal   Collection Time: 02-Jan-2019  7:33  PM   Specimen: Nasopharyngeal Swab  Result Value Ref Range Status   SARS Coronavirus 2 POSITIVE (A) NEGATIVE Final    Comment: emailed L. Berdik RN 14:15 04/02/19 ([email or web address]) (NOTE) SARS-CoV-2 target nucleic acids are DETECTED. The SARS-CoV-2 RNA is generally detectable in upper and lower respiratory specimens during the acute phase of infection. Positive results are indicative of the presence of SARS-CoV-2 RNA. Clinical correlation with patient history and other diagnostic information is  necessary to determine patient infection status. Positive results do not rule out bacterial infection or co-infection with other viruses.  The expected result is Negative. Fact Sheet for Patients: HairSlick.nohttps://www.fda.gov/media/138098/download Fact Sheet for Healthcare Providers: quierodirigir.comhttps://www.fda.gov/media/138095/download This test is not yet approved or cleared by the [place] FDA and  has been authorized for detection and/or diagnosis of SARS-CoV-2 by FDA under  an Emergency Use Authorization (EUA). This EUA will remain  in effect (meaning this test can be used) for the duration of the COVID-19 declaration un der Section 564(b)(1) of the Act, 21 U.S.C. section 360bbb-3(b)(1), unless the authorization is terminated or revoked sooner. Performed at [facility], [place] [address], [place], [place] [address]   C difficile quick scan w PCR reflex     Status: Abnormal   Collection Time: 04/02/19  5:49 AM   Specimen: STOOL  Result Value Ref Range Status   C Diff antigen POSITIVE (A) NEGATIVE Final   C Diff toxin NEGATIVE NEGATIVE Final   C Diff interpretation Results are indeterminate. See PCR results.  Final    Comment: Performed at [facility], [address], [place], [place] [address]  C. Diff by PCR, Reflexed     Status: None   Collection Time: 04/02/19  5:49 AM  Result Value Ref Range Status   Toxigenic C. Difficile by PCR NEGATIVE NEGATIVE Final    Comment: Patient is colonized with non  toxigenic C. difficile. May not need treatment unless significant symptoms are present. Performed at [facility], [place] [address], [place], [place] [address]   MRSA PCR Screening     Status: None   Collection Time: 04/02/19  1:10 PM   Specimen: Nasopharyngeal  Result Value Ref Range Status   MRSA by PCR NEGATIVE NEGATIVE Final    Comment:        The GeneXpert MRSA Assay (FDA approved for NASAL specimens only), is one component of a comprehensive MRSA colonization surveillance program. It is not intended to diagnose MRSA infection nor to guide or monitor treatment for MRSA infections. Performed at [facility], [address], [place], [place] [address]          Radiology Studies: No results found.      Scheduled Meds: . vitamin C  500 mg Oral Daily  . heparin injection (subcutaneous)  5,000 Units Subcutaneous Q8H  . methylPREDNISolone (SOLU-MEDROL) injection  30 mg Intravenous Q12H  . zinc sulfate  220 mg Oral Daily   Continuous Infusions: . sodium chloride 75 mL/hr at 04/03/19 1756  . remdesivir 100 mg in NS 100 mL 100 mg (04/03/19 1025)     LOS: 1 day    Time spent: 38mins    Kathie Dike, MD [facility]   If 7PM-7AM, please contact night-coverage [email or web address]  04/03/2019, 8:17 PM

## 2019-04-04 DIAGNOSIS — I10 Essential (primary) hypertension: Secondary | ICD-10-CM

## 2019-04-04 DIAGNOSIS — R7401 Elevation of levels of liver transaminase levels: Secondary | ICD-10-CM

## 2019-04-04 DIAGNOSIS — J1282 Pneumonia due to coronavirus disease 2019: Secondary | ICD-10-CM

## 2019-04-04 LAB — CBC WITH DIFFERENTIAL/PLATELET
Abs Immature Granulocytes: 0.02 10*3/uL (ref 0.00–0.07)
Basophils Absolute: 0 10*3/uL (ref 0.0–0.1)
Basophils Relative: 0 %
Eosinophils Absolute: 0 10*3/uL (ref 0.0–0.5)
Eosinophils Relative: 0 %
HCT: 39.3 % (ref 39.0–52.0)
Hemoglobin: 12.1 g/dL — ABNORMAL LOW (ref 13.0–17.0)
Immature Granulocytes: 0 %
Lymphocytes Relative: 8 %
Lymphs Abs: 0.5 10*3/uL — ABNORMAL LOW (ref 0.7–4.0)
MCH: 25.5 pg — ABNORMAL LOW (ref 26.0–34.0)
MCHC: 30.8 g/dL (ref 30.0–36.0)
MCV: 82.9 fL (ref 80.0–100.0)
Monocytes Absolute: 0.2 10*3/uL (ref 0.1–1.0)
Monocytes Relative: 3 %
Neutro Abs: 5.8 10*3/uL (ref 1.7–7.7)
Neutrophils Relative %: 89 %
Platelets: 207 10*3/uL (ref 150–400)
RBC: 4.74 MIL/uL (ref 4.22–5.81)
RDW: 14.2 % (ref 11.5–15.5)
WBC: 6.5 10*3/uL (ref 4.0–10.5)
nRBC: 0 % (ref 0.0–0.2)

## 2019-04-04 LAB — COMPREHENSIVE METABOLIC PANEL
ALT: 58 U/L — ABNORMAL HIGH (ref 0–44)
AST: 51 U/L — ABNORMAL HIGH (ref 15–41)
Albumin: 2.8 g/dL — ABNORMAL LOW (ref 3.5–5.0)
Alkaline Phosphatase: 106 U/L (ref 38–126)
Anion gap: 7 (ref 5–15)
BUN: 26 mg/dL — ABNORMAL HIGH (ref 8–23)
CO2: 19 mmol/L — ABNORMAL LOW (ref 22–32)
Calcium: 8.3 mg/dL — ABNORMAL LOW (ref 8.9–10.3)
Chloride: 112 mmol/L — ABNORMAL HIGH (ref 98–111)
Creatinine, Ser: 1.04 mg/dL (ref 0.61–1.24)
GFR calc Af Amer: >60 mL/min (ref >60)
GFR calc non Af Amer: >60 mL/min (ref >60)
Glucose, Bld: 109 mg/dL — ABNORMAL HIGH (ref 70–99)
Potassium: 5.6 mmol/L — ABNORMAL HIGH (ref 3.5–5.1)
Sodium: 138 mmol/L (ref 135–145)
Total Bilirubin: 0.5 mg/dL (ref 0.3–1.2)
Total Protein: 7.3 g/dL (ref 6.5–8.1)

## 2019-04-04 LAB — PHOSPHORUS: Phosphorus: 3.8 mg/dL (ref 2.5–4.6)

## 2019-04-04 LAB — MAGNESIUM: Magnesium: 1.8 mg/dL (ref 1.7–2.4)

## 2019-04-04 LAB — C-REACTIVE PROTEIN: CRP: 9.3 mg/dL — ABNORMAL HIGH (ref <1.0)

## 2019-04-04 LAB — D-DIMER, QUANTITATIVE: D-Dimer, Quant: 1.05 ug/mL-FEU — ABNORMAL HIGH (ref 0.00–0.50)

## 2019-04-04 LAB — FERRITIN: Ferritin: 367 ng/mL — ABNORMAL HIGH (ref 24–336)

## 2019-04-04 MED ORDER — SODIUM ZIRCONIUM CYCLOSILICATE 10 G PO PACK
10.0000 g | PACK | Freq: Two times a day (BID) | ORAL | Status: DC
  Administered 2019-04-04 – 2019-04-05 (×4): 10 g via ORAL
  Filled 2019-04-04 (×4): qty 1

## 2019-04-04 MED ORDER — AZITHROMYCIN 250 MG PO TABS
500.0000 mg | ORAL_TABLET | Freq: Every day | ORAL | Status: DC
  Administered 2019-04-04 – 2019-04-05 (×2): 500 mg via ORAL
  Filled 2019-04-04 (×3): qty 2

## 2019-04-04 MED ORDER — SODIUM CHLORIDE 0.9 % IV SOLN
1.0000 g | INTRAVENOUS | Status: DC
  Administered 2019-04-04 – 2019-04-05 (×2): 1 g via INTRAVENOUS
  Filled 2019-04-04 (×3): qty 10

## 2019-04-04 NOTE — Progress Notes (Addendum)
PROGRESS NOTE  Jesus Lin GHW:[id number] DOB: 29-Sep-1945 DOA: 2019-04-21 PCP: Avon Gully, MD  Brief History:  74 year old male with a history of alcohol abuse, COPD, hypertension, gout arthritis presenting from his skilled nursing facility secondary to increasing lethargy and hypoxia.  Apparently, the patient needed 3 L nasal cannula at his nursing facility.  Upon arrival, the patient was noted to have increasing serum creatinine and WBC 15.1.  Chest x-ray showed increasing left lower lobe opacity concerning for pneumonia.  The patient was tested positive for COVID-19.  He was started on steroids and remdesivir.  He was also noted to be hypokalemia requiring dosing of Lokelma.    Assessment/Plan: Acute respiratory failure with hypoxia -Secondary to COVID-19 pneumonia -Patient has been weaned to room air -CRP 19.9>>>9.3 -D-Dimer 1.27>>>1.05 -Ferritin 402>> 367 -Procalcitonin 4.07>>> 1.68 -Continue remdesivir and IV steroids  Lobar pneumonia -Given the patient's leukocytosis upon presentation with elevated procalcitonin, I am concerned about CAP -Restart ceftriaxone and azithromycin  AKI -baseline creatinine 0.9-1.1 -serum creatinine peaked 2.02 -improving with IVF  Essential hypertension -Holding atenolol secondary to bradycardia and soft blood pressures  Loose stool -C. difficile test that returned positive for infection, negative for toxin and negative for PCR.  Currently, he only had 1 stool today.  Suspect that he is colonized with C. Difficile  COPD -Continue bronchodilators -No wheezing presently  Hyperkalemia -Continue IV fluids -redose Lokelma  Transaminasemia -due to COVID infection/remdesivir -stable -am LFTs     Disposition Plan:   SNF in 2 days  Family Communication:   Family at bedside  Consultants:  none  Code Status:  FULL  DVT Prophylaxis:  [place] Heparin    Procedures: As Listed in Progress Note  Above  Antibiotics: Ceftriaxone azithromycin       Subjective: Patient denies fevers, chills, headache, chest pain, dyspnea, nausea, vomiting, diarrhea, abdominal pain, dysuria, hematuria, hematochezia, and melena.   Objective: Vitals:   04/02/19 1416 04/02/19 2223 04/03/19 0515 04/03/19 2226  BP: 121/69 101/63 106/64 108/69  Pulse: 70 (!) 53 (!) 53 74  Resp: 18 18 20    Temp: 98.1 F (36.7 C) (!) 97.5 F (36.4 C) (!) 97.5 F (36.4 C) (!) 97.5 F (36.4 C)  TempSrc: Oral Oral Axillary Axillary  SpO2: 98% (!) 59% 98% 98%  Weight:      Height:        Intake/Output Summary (Last 24 hours) at 04/04/2019 1005 Last data filed at 04/03/2019 2300 Gross per 24 hour  Intake 30 ml  Output 600 ml  Net -570 ml   Weight change:  Exam:   General:  Pt is alert, follows commands appropriately, not in acute distress  HEENT: No icterus, No thrush, No neck mass, [place]/AT  Cardiovascular: RRR, S1/S2, no rubs, no gallops  Respiratory: bibasilar rales, no wheeze  Abdomen: Soft/+BS, non tender, non distended, no guarding  Extremities: No edema, No lymphangitis, No petechiae, No rashes, no synovitis   Data Reviewed: I have personally reviewed following labs and imaging studies Basic Metabolic Panel: Recent Labs  Lab 04/21/2019 1734 04/02/19 0406 04/03/19 0653 04/04/19 0700  NA 140 141 141 138  K 6.1* 5.6* 6.0* 5.6*  CL 108 110 110 112*  CO2 22 19* 19* 19*  GLUCOSE 92 251* 98 109*  BUN 33* 30* 28* 26*  CREATININE 2.02* 1.56* 1.13 1.04  CALCIUM 8.6* 8.2* 8.6* 8.3*  MG  --  1.7 1.7 1.8  PHOS 3.1 3.2 3.7  3.8   Liver Function Tests: Recent Labs  Lab 04/23/19 1734 04/02/19 0406 04/03/19 0653 04/04/19 0700  AST 79* 46* 59* 51*  ALT 68* 48* 54* 58*  ALKPHOS 127* 96 104 106  BILITOT 0.8 0.5 0.5 0.5  PROT 8.2* 6.3* 6.8 7.3  ALBUMIN 3.2* 2.5* 2.5* 2.8*   No results for input(s): LIPASE, AMYLASE in the last 168 hours. No results for input(s): AMMONIA in the last 168  hours. Coagulation Profile: No results for input(s): INR, PROTIME in the last 168 hours. CBC: Recent Labs  Lab 04/23/2019 1734 04/02/19 0406 04/03/19 0653 04/04/19 0700  WBC 15.1* 9.0 3.9* 6.5  NEUTROABS 13.6* 7.5 3.3 5.8  HGB 13.0 11.5* 11.4* 12.1*  HCT 42.4 37.9* 37.2* 39.3  MCV 83.1 85.2 83.8 82.9  PLT 239 171 194 207   Cardiac Enzymes: Recent Labs  Lab Apr 23, 2019 1734  CKTOTAL 53   BNP: Invalid input(s): POCBNP CBG: No results for input(s): GLUCAP in the last 168 hours. HbA1C: No results for input(s): HGBA1C in the last 72 hours. Urine analysis:    Component Value Date/Time   COLORURINE AMBER (A) 2019/04/23 1745   APPEARANCEUR CLOUDY (A) 04/23/2019 1745   LABSPEC 1.027 2019/04/23 1745   PHURINE 5.0 04/23/19 1745   GLUCOSEU NEGATIVE 23-Apr-2019 1745   HGBUR LARGE (A) 04/23/19 1745   BILIRUBINUR NEGATIVE 2019/04/23 1745   KETONESUR NEGATIVE Apr 23, 2019 1745   PROTEINUR 100 (A) 2019/04/23 1745   UROBILINOGEN 0.2 04/09/2013 0132   NITRITE NEGATIVE 04/23/2019 1745   LEUKOCYTESUR NEGATIVE Apr 23, 2019 1745   Sepsis Labs: @LABRCNTIP (procalcitonin:4,lacticidven:4) ) Recent Results (from the past 240 hour(s))  Urine culture     Status: Abnormal   Collection Time: 2019-04-23  5:45 PM   Specimen: Urine, Clean Catch  Result Value Ref Range Status   Specimen Description   Final    URINE, CLEAN CATCH Performed at [facility], [address] [place], [place]    Special Requests   Final    NONE Performed at [facility], [address] [place], [place]    Culture (A)  Final    10,000 COLONIES/mL MULTIPLE SPECIES PRESENT, SUGGEST RECOLLECTION   Report Status 04/03/2019 FINAL  Final  SARS CORONAVIRUS 2 ([place] 6-24 HRS) Nasopharyngeal Nasopharyngeal Swab     Status: Abnormal   Collection Time: 23-Apr-2019  7:33 PM   Specimen: Nasopharyngeal Swab  Result Value Ref Range Status   SARS Coronavirus 2 POSITIVE (A) NEGATIVE Final    Comment: emailed L. Berdik  RN 14:15 04/02/19 ([email or web address]) (NOTE) SARS-CoV-2 target nucleic acids are DETECTED. The SARS-CoV-2 RNA is generally detectable in upper and lower respiratory specimens during the acute phase of infection. Positive results are indicative of the presence of SARS-CoV-2 RNA. Clinical correlation with patient history and other diagnostic information is  necessary to determine patient infection status. Positive results do not rule out bacterial infection or co-infection with other viruses.  The expected result is Negative. Fact Sheet for Patients: 04/04/19 Fact Sheet for Healthcare Providers: HairSlick.no This test is not yet approved or cleared by the [place]ier[place]irigir.com FDA and  has been authorized for detection and/or diagnosis of SARS-CoV-2 by FDA under an Emergency Use Authorization (EUA). This EUA will remain  in effect (meaning this test can be used) for the duration of the COVID-19 declaration un der Section 564(b)(1) of the Act, 21 U.S.C. section 360bbb-3(b)(1), unless the authorization is terminated or revoked sooner. Performed at [facility], [address] [place], [place]  [address]   C difficile quick scan w PCR reflex     Status: Abnormal   Collection Time: 04/02/19  5:49 AM   Specimen: STOOL  Result Value Ref Range Status   C Diff antigen POSITIVE (A) NEGATIVE Final   C Diff toxin NEGATIVE NEGATIVE Final   C Diff interpretation Results are indeterminate. See PCR results.  Final    Comment: Performed at [facility], [address] [place], [place] [address]  C. Diff by PCR, Reflexed     Status: None   Collection Time: 04/02/19  5:49 AM  Result Value Ref Range Status   Toxigenic C. Difficile by PCR NEGATIVE NEGATIVE Final    Comment: Patient is colonized with non toxigenic C. difficile. May not need treatment unless significant symptoms are present. Performed at [facility], [place] [address],  [place], [place] [address]   MRSA PCR Screening     Status: None   Collection Time: 04/02/19  1:10 PM   Specimen: Nasopharyngeal  Result Value Ref Range Status   MRSA by PCR NEGATIVE NEGATIVE Final    Comment:        The GeneXpert MRSA Assay (FDA approved for NASAL specimens only), is one component of a comprehensive MRSA colonization surveillance program. It is not intended to diagnose MRSA infection nor to guide or monitor treatment for MRSA infections. Performed at [facility], [address], [place], [place] [address]      Scheduled Meds: . vitamin C  500 mg Oral Daily  . azithromycin  500 mg Oral Daily  . heparin injection (subcutaneous)  5,000 Units Subcutaneous Q8H  . methylPREDNISolone (SOLU-MEDROL) injection  30 mg Intravenous Q12H  . zinc sulfate  220 mg Oral Daily   Continuous Infusions: . sodium chloride 75 mL/hr at 04/03/19 1756  . cefTRIAXone (ROCEPHIN)  IV    . remdesivir 100 mg in NS 100 mL 100 mg (04/03/19 1025)    Procedures/Studies: DG Chest Portable 1 View  Result Date: 2019-04-05 CLINICAL DATA:  Cough history of COVID. EXAM: PORTABLE CHEST 1 VIEW COMPARISON:  04/09/2013 FINDINGS: Cardiomediastinal contours are stable with along the a shin of the heart. Lungs are hyperinflated as before. Likely with some background of pulmonary emphysema. Added interstitial prominence in the left lung base is noted on today's exam with some mild blunting of the left costodiaphragmatic sulcus. Right lung is clear. No acute bone process. IMPRESSION: 1. Findings suspicious for developing pneumonia in the left lung base. 2. Probable concomitant small left effusion. 3. COPD. Electronically Signed   By: Zetta Bills M.D.   On: April 05, 2019 18:29    Orson Eva, DO  [facility] Pager [phone]  If 7PM-7AM, please contact night-coverage [email or web address] Password [email or web address] 04/04/2019, 10:05 AM   LOS: 2 days

## 2019-04-04 DEATH — deceased

## 2019-04-05 LAB — CBC WITH DIFFERENTIAL/PLATELET
Abs Immature Granulocytes: 0.02 10*3/uL (ref 0.00–0.07)
Basophils Absolute: 0 10*3/uL (ref 0.0–0.1)
Basophils Relative: 0 %
Eosinophils Absolute: 0 10*3/uL (ref 0.0–0.5)
Eosinophils Relative: 0 %
HCT: 39 % (ref 39.0–52.0)
Hemoglobin: 12 g/dL — ABNORMAL LOW (ref 13.0–17.0)
Immature Granulocytes: 0 %
Lymphocytes Relative: 11 %
Lymphs Abs: 0.5 10*3/uL — ABNORMAL LOW (ref 0.7–4.0)
MCH: 25.8 pg — ABNORMAL LOW (ref 26.0–34.0)
MCHC: 30.8 g/dL (ref 30.0–36.0)
MCV: 83.9 fL (ref 80.0–100.0)
Monocytes Absolute: 0.1 10*3/uL (ref 0.1–1.0)
Monocytes Relative: 3 %
Neutro Abs: 3.9 10*3/uL (ref 1.7–7.7)
Neutrophils Relative %: 86 %
Platelets: 224 10*3/uL (ref 150–400)
RBC: 4.65 MIL/uL (ref 4.22–5.81)
RDW: 14.6 % (ref 11.5–15.5)
WBC: 4.6 10*3/uL (ref 4.0–10.5)
nRBC: 0 % (ref 0.0–0.2)

## 2019-04-05 LAB — PHOSPHORUS: Phosphorus: 3.8 mg/dL (ref 2.5–4.6)

## 2019-04-05 LAB — COMPREHENSIVE METABOLIC PANEL
ALT: 59 U/L — ABNORMAL HIGH (ref 0–44)
AST: 43 U/L — ABNORMAL HIGH (ref 15–41)
Albumin: 2.8 g/dL — ABNORMAL LOW (ref 3.5–5.0)
Alkaline Phosphatase: 97 U/L (ref 38–126)
Anion gap: 10 (ref 5–15)
BUN: 25 mg/dL — ABNORMAL HIGH (ref 8–23)
CO2: 21 mmol/L — ABNORMAL LOW (ref 22–32)
Calcium: 8.3 mg/dL — ABNORMAL LOW (ref 8.9–10.3)
Chloride: 111 mmol/L (ref 98–111)
Creatinine, Ser: 1.06 mg/dL (ref 0.61–1.24)
GFR calc Af Amer: >60 mL/min (ref >60)
GFR calc non Af Amer: >60 mL/min (ref >60)
Glucose, Bld: 107 mg/dL — ABNORMAL HIGH (ref 70–99)
Potassium: 5.2 mmol/L — ABNORMAL HIGH (ref 3.5–5.1)
Sodium: 142 mmol/L (ref 135–145)
Total Bilirubin: 0.3 mg/dL (ref 0.3–1.2)
Total Protein: 7.1 g/dL (ref 6.5–8.1)

## 2019-04-05 LAB — MAGNESIUM: Magnesium: 1.8 mg/dL (ref 1.7–2.4)

## 2019-04-05 LAB — FERRITIN: Ferritin: 284 ng/mL (ref 24–336)

## 2019-04-05 LAB — C-REACTIVE PROTEIN: CRP: 5.6 mg/dL — ABNORMAL HIGH (ref <1.0)

## 2019-04-05 LAB — PROCALCITONIN: Procalcitonin: 0.18 ng/mL

## 2019-04-05 LAB — D-DIMER, QUANTITATIVE: D-Dimer, Quant: 0.76 ug/mL-FEU — ABNORMAL HIGH (ref 0.00–0.50)

## 2019-04-05 MED ORDER — ZINC SULFATE 220 (50 ZN) MG PO CAPS: 220.0000 mg | ORAL_CAPSULE | Freq: Every day | ORAL | Status: AC

## 2019-04-05 NOTE — Progress Notes (Signed)
PROGRESS NOTE  Jesus Lin ZOX:[id number] DOB: 27-Feb-1946 DOA: 03/19/2019 PCP: Avon Gully, MD  Brief History:  74 year old male with a history of alcohol abuse, COPD, hypertension, gout arthritis presenting from his skilled nursing facility secondary to increasing lethargy and hypoxia.  Apparently, the patient needed 3 L nasal cannula at his nursing facility.  Upon arrival, the patient was noted to have increasing serum creatinine and WBC 15.1.  Chest x-ray showed increasing left lower lobe opacity concerning for pneumonia.  The patient was tested positive for COVID-19.  He was started on steroids and remdesivir.  He was also noted to be hypokalemia requiring dosing of Lokelma.    Assessment/Plan: Acute respiratory failure with hypoxia -Secondary to COVID-19 pneumonia -Patient has been weaned to room air -CRP 19.9>>>9.3>>>5.6 -D-Dimer 1.27>>>1.05>>0.76 -Ferritin 402>> 367>>>284 -Procalcitonin 4.07>>> 1.68>>>0.18 -Continue remdesivir and IV steroids  Lobar pneumonia -Given the patient's leukocytosis upon presentation with elevated procalcitonin, I am concerned about CAP -continue ceftriaxone and azithromycin  AKI -baseline creatinine 0.9-1.1 -serum creatinine peaked 2.02 -improving with IVF  Essential hypertension -Holding atenolol secondary to bradycardia and soft blood pressures -BP now improving  Loose stool -C. difficile test that returned positive for infection, negative for toxin and negative for PCR. Currently, he only had 1 stool today. Suspect that he is colonized with C. Difficile -overall improved  COPD -Continue bronchodilators -No wheezing presently  Hyperkalemia -Continue IV fluids -redose Lokelma  Transaminasemia -due to COVID infection/remdesivir -stable -am LFTs     Disposition Plan:   SNF 1/3 if stable Family Communication:  No Family at bedside  Consultants:  none  Code Status:  FULL  DVT Prophylaxis:   [place] Heparin    Procedures: As Listed in Progress Note Above  Antibiotics: Ceftriaxone azithromycin     Subjective: Patient denies fevers, chills, headache, chest pain, dyspnea, nausea, vomiting, diarrhea, abdominal pain, dysuria, hematuria, hematochezia, and melena.   Objective: Vitals:   04/03/19 0515 04/03/19 2226 04/04/19 2156 04/05/19 0607  BP: 106/64 108/69 128/74 132/78  Pulse: (!) 53 74 63 (!) 57  Resp: 20  18 18   Temp: (!) 97.5 F (36.4 C) (!) 97.5 F (36.4 C) (!) 97.5 F (36.4 C)   TempSrc: Axillary Axillary Oral   SpO2: 98% 98% 96% 98%  Weight:      Height:        Intake/Output Summary (Last 24 hours) at 04/05/2019 1258 Last data filed at 04/05/2019 1052 Gross per 24 hour  Intake 5751.17 ml  Output 1100 ml  Net 4651.17 ml   Weight change:  Exam:   General:  Pt is alert, follows commands appropriately, not in acute distress  HEENT: No icterus, No thrush, No neck mass, [place]/AT  Cardiovascular: RRR, S1/S2, no rubs, no gallops  Respiratory: bibasilar rales. No wheeze  Abdomen: Soft/+BS, non tender, non distended, no guarding  Extremities: No edema, No lymphangitis, No petechiae, No rashes, no synovitis   Data Reviewed: I have personally reviewed following labs and imaging studies Basic Metabolic Panel: Recent Labs  Lab 03/05/2019 1734 04/02/19 0406 04/03/19 0653 04/04/19 0700 04/05/19 0714  NA 140 141 141 138 142  K 6.1* 5.6* 6.0* 5.6* 5.2*  CL 108 110 110 112* 111  CO2 22 19* 19* 19* 21*  GLUCOSE 92 251* 98 109* 107*  BUN 33* 30* 28* 26* 25*  CREATININE 2.02* 1.56* 1.13 1.04 1.06  CALCIUM 8.6* 8.2* 8.6* 8.3* 8.3*  MG  --  1.7 1.7  1.8 1.8  PHOS 3.1 3.2 3.7 3.8 3.8   Liver Function Tests: Recent Labs  Lab 04-28-2019 1734 04/02/19 0406 04/03/19 0653 04/04/19 0700 04/05/19 0714  AST 79* 46* 59* 51* 43*  ALT 68* 48* 54* 58* 59*  ALKPHOS 127* 96 104 106 97  BILITOT 0.8 0.5 0.5 0.5 0.3  PROT 8.2* 6.3* 6.8 7.3 7.1  ALBUMIN 3.2* 2.5*  2.5* 2.8* 2.8*   No results for input(s): LIPASE, AMYLASE in the last 168 hours. No results for input(s): AMMONIA in the last 168 hours. Coagulation Profile: No results for input(s): INR, PROTIME in the last 168 hours. CBC: Recent Labs  Lab April 28, 2019 1734 04/02/19 0406 04/03/19 0653 04/04/19 0700 04/05/19 0714  WBC 15.1* 9.0 3.9* 6.5 4.6  NEUTROABS 13.6* 7.5 3.3 5.8 3.9  HGB 13.0 11.5* 11.4* 12.1* 12.0*  HCT 42.4 37.9* 37.2* 39.3 39.0  MCV 83.1 85.2 83.8 82.9 83.9  PLT 239 171 194 207 224   Cardiac Enzymes: Recent Labs  Lab April 28, 2019 1734  CKTOTAL 53   BNP: Invalid input(s): POCBNP CBG: No results for input(s): GLUCAP in the last 168 hours. HbA1C: No results for input(s): HGBA1C in the last 72 hours. Urine analysis:    Component Value Date/Time   COLORURINE AMBER (A) 28-Apr-2019 1745   APPEARANCEUR CLOUDY (A) 04/28/2019 1745   LABSPEC 1.027 04-28-19 1745   PHURINE 5.0 04/28/2019 1745   GLUCOSEU NEGATIVE 2019-04-28 1745   HGBUR LARGE (A) 04-28-2019 1745   BILIRUBINUR NEGATIVE 04/28/19 1745   KETONESUR NEGATIVE 04/28/2019 1745   PROTEINUR 100 (A) April 28, 2019 1745   UROBILINOGEN 0.2 04/09/2013 0132   NITRITE NEGATIVE 04/28/2019 1745   LEUKOCYTESUR NEGATIVE 04/28/19 1745   Sepsis Labs: @LABRCNTIP (procalcitonin:4,lacticidven:4) ) Recent Results (from the past 240 hour(s))  Urine culture     Status: Abnormal   Collection Time: 04/28/19  5:45 PM   Specimen: Urine, Clean Catch  Result Value Ref Range Status   Specimen Description   Final    URINE, CLEAN CATCH Performed at [facility], [address], [place], [place] [address]    Special Requests   Final    NONE Performed at [facility], [address], [place], [place] [address]    Culture (A)  Final    10,000 COLONIES/mL MULTIPLE SPECIES PRESENT, SUGGEST RECOLLECTION   Report Status 04/03/2019 FINAL  Final  SARS CORONAVIRUS 2 ([place]na [place] 6-24 HRS) Nasopharyngeal Nasopharyngeal Swab     Status: Abnormal    Collection Time: 04/28/19  7:33 PM   Specimen: Nasopharyngeal Swab  Result Value Ref Range Status   SARS Coronavirus 2 POSITIVE (A) NEGATIVE Final    Comment: emailed L. Berdik RN 14:15 04/02/19 ([email or web address]) (NOTE) SARS-CoV-2 target nucleic acids are DETECTED. The SARS-CoV-2 RNA is generally detectable in upper and lower respiratory specimens during the acute phase of infection. Positive results are indicative of the presence of SARS-CoV-2 RNA. Clinical correlation with patient history and other diagnostic information is  necessary to determine patient infection status. Positive results do not rule out bacterial infection or co-infection with other viruses.  The expected result is Negative. Fact Sheet for Patients: SugarRoll.be Fact Sheet for Healthcare Providers: https://www.woods-mathews.com/ This test is not yet approved or cleared by the [place] FDA and  has been authorized for detection and/or diagnosis of SARS-CoV-2 by FDA under an Emergency Use Authorization (EUA). This EUA will remain  in effect (meaning this test can be used) for the duration of the COVID-19 declaration un der Section 564(b)(1) of the Act,  21 U.S.C. section 360bbb-3(b)(1), unless the authorization is terminated or revoked sooner. Performed at [facility], [address], [place], [place] [address]   C difficile quick scan w PCR reflex     Status: Abnormal   Collection Time: 04/02/19  5:49 AM   Specimen: STOOL  Result Value Ref Range Status   C Diff antigen POSITIVE (A) NEGATIVE Final   C Diff toxin NEGATIVE NEGATIVE Final   C Diff interpretation Results are indeterminate. See PCR results.  Final    Comment: Performed at [facility], [address], [place], [place] [address]  C. Diff by PCR, Reflexed     Status: None   Collection Time: 04/02/19  5:49 AM  Result Value Ref Range Status   Toxigenic C. Difficile by PCR NEGATIVE NEGATIVE Final    Comment:  Patient is colonized with non toxigenic C. difficile. May not need treatment unless significant symptoms are present. Performed at [facility], [address] [place], [place] [address]   MRSA PCR Screening     Status: None   Collection Time: 04/02/19  1:10 PM   Specimen: Nasopharyngeal  Result Value Ref Range Status   MRSA by PCR NEGATIVE NEGATIVE Final    Comment:        The GeneXpert MRSA Assay (FDA approved for NASAL specimens only), is one component of a comprehensive MRSA colonization surveillance program. It is not intended to diagnose MRSA infection nor to guide or monitor treatment for MRSA infections. Performed at [facility], [address], [place], [place] [address]      Scheduled Meds: . vitamin C  500 mg Oral Daily  . azithromycin  500 mg Oral Daily  . heparin injection (subcutaneous)  5,000 Units Subcutaneous Q8H  . methylPREDNISolone (SOLU-MEDROL) injection  30 mg Intravenous Q12H  . sodium zirconium cyclosilicate  10 g Oral BID  . zinc sulfate  220 mg Oral Daily   Continuous Infusions: . sodium chloride Stopped (04/05/19 1028)  . cefTRIAXone (ROCEPHIN)  IV Stopped (04/05/19 1009)  . remdesivir 100 mg in NS 100 mL 200 mL/hr at 04/05/19 1052    Procedures/Studies: DG Chest Portable 1 View  Result Date: 04-02-19 CLINICAL DATA:  Cough history of COVID. EXAM: PORTABLE CHEST 1 VIEW COMPARISON:  04/09/2013 FINDINGS: Cardiomediastinal contours are stable with along the a shin of the heart. Lungs are hyperinflated as before. Likely with some background of pulmonary emphysema. Added interstitial prominence in the left lung base is noted on today's exam with some mild blunting of the left costodiaphragmatic sulcus. Right lung is clear. No acute bone process. IMPRESSION: 1. Findings suspicious for developing pneumonia in the left lung base. 2. Probable concomitant small left effusion. 3. COPD. Electronically Signed   By: Donzetta Kohut M.D.   On: 04/02/2019  18:29    Catarina Hartshorn, DO  [facility] Pager [phone]  If 7PM-7AM, please contact night-coverage [email or web address] Password [email or web address] 04/05/2019, 12:58 PM   LOS: 3 days

## 2019-04-06 DIAGNOSIS — J9601 Acute respiratory failure with hypoxia: Secondary | ICD-10-CM

## 2019-04-06 DIAGNOSIS — K920 Hematemesis: Secondary | ICD-10-CM

## 2019-04-06 MED ORDER — AMIODARONE IV BOLUS ONLY 150 MG/100ML
INTRAVENOUS | Status: AC
  Filled 2019-04-06: qty 100

## 2019-04-06 MED ORDER — AMIODARONE HCL IN DEXTROSE 360-4.14 MG/200ML-% IV SOLN
INTRAVENOUS | Status: AC
  Filled 2019-04-06: qty 200

## 2019-05-05 NOTE — Progress Notes (Signed)
Spoke with patients sister Mitzi Davenport and offered her the opportunity to have some time with her brother since he expired. At this time she admits that she is concerned for her safety as I did  informed her this is a COVID unit. Sister instructed that PPE would certainly be  made available for family should they chose to visit the patient. She is encouraged to call primary RN (number provided) should they make this decision/ she is agreeable, will callback if they change their minds about visit in the next hour . Rn again offered condolences

## 2019-05-05 NOTE — Progress Notes (Signed)
RN spoke with patients son and offered condolences. He is thankful for care and support by staff. Admits his dad has been sick for sometime and felt at some point  Poor outcomes could occur

## 2019-05-05 NOTE — ED Provider Notes (Signed)
Department of Emergency Medicine   Code Blue CONSULT NOTE  Chief Complaint: Cardiac arrest/unresponsive   Level V Caveat: Unresponsive  History of present illness: I was contacted by the hospital for a CODE BLUE cardiac arrest upstairs and presented to the patient's bedside. CPR in progress. Patient was apparently awake and alert earlier this AM but called out to staff in the setting of vomiting and then became unresponsive.   ROS: Unable to obtain, Level V caveat  Scheduled Meds: . vitamin C  500 mg Oral Daily  . azithromycin  500 mg Oral Daily  . heparin injection (subcutaneous)  5,000 Units Subcutaneous Q8H  . methylPREDNISolone (SOLU-MEDROL) injection  30 mg Intravenous Q12H  . sodium zirconium cyclosilicate  10 g Oral BID  . zinc sulfate  220 mg Oral Daily   Continuous Infusions: . sodium chloride Stopped (04/05/19 1844)  . cefTRIAXone (ROCEPHIN)  IV Stopped (04/05/19 1009)  . remdesivir 100 mg in NS 100 mL Stopped (04/05/19 1058)   PRN Meds:.acetaminophen **OR** acetaminophen, chlorpheniramine-HYDROcodone, guaiFENesin-dextromethorphan, Ipratropium-Albuterol, ondansetron **OR** ondansetron (ZOFRAN) IV Past Medical History:  Diagnosis Date  . Alcohol abuse   . COPD (chronic obstructive pulmonary disease) ([place])   . Gout   . Hypertension   . Seizures ([place])   . Stroke [facility])    Past Surgical History:  Procedure Laterality Date  . PERIPHERAL VASCULAR CATHETERIZATION N/A 07/30/2015   Procedure: Abdominal Aortogram w/ Bilateral Lower Extremity Runoff;  Surgeon: Elam Dutch, MD;  Location: [place] [facility];  Service: Cardiovascular;  Laterality: N/A;   Social History   Socioeconomic History  . Marital status: Divorced    Spouse name: Not on file  . Number of children: Not on file  . Years of education: Not on file  . Highest education level: Not on file  Occupational History  . Not on file  Tobacco Use  . Smoking status: Current Every Day Smoker    Packs/day:  1.00    Types: Cigarettes  . Smokeless tobacco: Never Used  Substance and Sexual Activity  . Alcohol use: Yes    Alcohol/week: 0.0 standard drinks    Comment: liquor daily  . Drug use: No  . Sexual activity: Not on file  Other Topics Concern  . Not on file  Social History Narrative  . Not on file   Social Determinants of Health   Financial Resource Strain:   . Difficulty of Paying Living Expenses: Not on file  Food Insecurity:   . Worried About Charity fundraiser in the Last Year: Not on file  . Ran Out of Food in the Last Year: Not on file  Transportation Needs:   . Lack of Transportation (Medical): Not on file  . Lack of Transportation (Non-Medical): Not on file  Physical Activity:   . Days of Exercise per Week: Not on file  . Minutes of Exercise per Session: Not on file  Stress:   . Feeling of Stress : Not on file  Social Connections:   . Frequency of Communication with Friends and Family: Not on file  . Frequency of Social Gatherings with Friends and Family: Not on file  . Attends Religious Services: Not on file  . Active Member of Clubs or Organizations: Not on file  . Attends Archivist Meetings: Not on file  . Marital Status: Not on file  Intimate Partner Violence:   . Fear of Current or Ex-Partner: Not on file  . Emotionally Abused: Not on file  .  Physically Abused: Not on file  . Sexually Abused: Not on file   No Known Allergies  Last set of Vital Signs (not current) Vitals:   04/05/19 0607 05/04/2019 0500  BP: 132/78 140/85  Pulse: (!) 57 78  Resp: 18 20  Temp:  98 F (36.7 C)  SpO2: 98% 96%      Physical Exam  Gen: unresponsive Cardiovascular: pulseless  Resp: apneic. Breath sounds equal bilaterally with bagging  Abd: nondistended  Neuro: GCS 3, unresponsive to pain  HEENT: No blood in posterior pharynx, gag reflex absent  Neck: No crepitus  Musculoskeletal: No deformity  Skin: warm  Procedures  INTUBATION Performed by: Maia Plan Required items: required blood products, implants, devices, and special equipment available Patient identity confirmed: provided demographic data and hospital-assigned identification number Time out: Immediately prior to procedure a "time out" was called to verify the correct patient, procedure, equipment, support staff and site/side marked as required. Indications: Unresponsive Intubation method: Video laryngoscopy Preoxygenation: 93 BVM Sedatives: None  Paralytic: None Tube Size: 7.5 cuffed Post-procedure assessment: chest rise with visualization through the cords. Breath sounds: equal and absent over the epigastrium Tube secured by Respiratory Therapy Patient tolerated the procedure well with no immediate complications.   Medical Decision making  Arrived to patient bedside to find CPR in progress.  There is dark emesis on the bed sheets.  Suspect aspiration. Dt. Tat is directing CPR and I was asked to assist with the airway.   Assessment and Plan  1. Patient intubated as above with CPR in progress. No interruption in chest compressions for intubation. Tube secured by RT. Dr. Arbutus Leas to continue resuscitative efforts and discuss with family.   Alona Bene, MD Emergency Medicine    [place], Arlyss Repress, MD 04/11/2019 [phone]

## 2019-05-05 NOTE — Progress Notes (Addendum)
Patient was seen and evaluated around 7:20 AM by me.  At that time, the patient was alert and oriented and conversant and following commands.  The patient did not have any complaints at that time.  Specifically he had denied any chest pain, shortness of breath, headache, nausea, vomiting, abdominal pain.  The patient was looking forward for discharge back to his nursing facility later today pending the results of his am labs. The patient's morning labs were still pending at the time of my evaluation initially.  His vitals have remained stable with temperature maximum 98.0, heart rate 78, respirations 20, blood pressure 140/85 with oxygen saturation 96-98% room air. His physical examination at the time of my initial evaluation CV-RRR, no rub Lung-bibasilar rales. No wheeze. Good air movement Abd-soft/ND/NT+BS Ext--no edema, no rash.  Around 7:40 AM as I was leaving the unit, I heard the patient holler in the room.  I went to see the patient at which time he noted the patient to have coffee-ground emesis coming out of his mouth and he was unresponsive to verbal or protopathic stimuli.  I was not able to palpate any pulses.  CODE BLUE was called immediately.  As the patient was on the monitor, I noted the patient to have sinus bradycardia with RBBB morphology which he has had since admission. ACLS protocol was instituted and carried out. The patient's family was contacted and updated on his grave medical condition. ACLS protocol was carried out for 43 minutes.  Unfortunately, the patient never really gained spontaneous circulation.  He was subsequently pronounced dead at 8:27 AM.  DTat

## 2019-05-05 NOTE — Death Summary Note (Signed)
DEATH SUMMARY   Patient Details  Name: Jesus Lin MRN: [id number] DOB: 28-Oct-1945  Admission/Discharge Information   Admit Date:  April 24, 2019  Date of Death:  2019/04/29  Time of Death:    Length of Stay: 4  Referring Physician: Avon Gully, MD   Reason(s) for Hospitalization  Hypoxia, dyspnea  Diagnoses  Preliminary cause of death: acute respiratory failure from aspiration pneumonitis and COVID 19 pneumonia Secondary Diagnoses (including complications and co-morbidities):  Acute respiratory failure with hypoxia -Secondary to COVID-19 pneumonia -Patient has been weaned to room air -CRP 19.9>>>9.3>>>5.6 -D-Dimer 1.27>>>1.05>>0.76 -Ferritin 402>>367>>>284 -Procalcitonin 4.07>>> 1.68>>>0.18 -Continued remdesivir and IV steroids  Lobar pneumonia -Given the patient's leukocytosis upon presentation with elevated procalcitonin,I am concerned about CAP -continued ceftriaxone and azithromycin  AKI -baseline creatinine 0.9-1.1 -serum creatinine peaked 2.02 -improving with IVF-->improved to 1.06  Essential hypertension -Holding atenolol secondary to bradycardia and soft blood pressures -BP improved and becoming hypertensive again -HR improved to 70s  Loose stool -C. difficile test that returned positive for infection, negative for toxin and negative for PCR.  Suspect that he is colonized with C. 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 Darrick Penna  History of cocaine and alcohol abuse   Brief Hospital Course (including significant findings, care, treatment, and services provided and events leading to death)  Jesus Lin is a 74 y.o. year old male with a history of alcohol and cocaine abuse, R-MCA stroke, HTN, COPD, hypertension, gout arthritis presenting from his skilled nursing facility secondary to increasing lethargy and hypoxia. Apparently, the patient needed 3 L nasal cannula at his nursing facility. Upon arrival, the patient was noted to have increasing serum creatinine and WBC 15.1, serum creatinine 2.02 with potassium 6.1. Chest x-ray showed increasing left lower lobe opacity concerning for pneumonia. The patient was tested positive for COVID-19. He was started on steroids and remdesivir. He was also noted to be hyerokalemia requiring dosing of Lokelma daily.  He was maintained on telemetry during the entire hospitalization without concerning dysrhythmia.  He gradually improved clinically.  He was weaned off of his oxygen and tolerated his diet.  His mental status improved and returned to baseline.  His hyperkalemia gradually improved on Lokelma and IVF.  His inflammatory markers gradually improved during the hospitalization.  He was anticipated for discharge on 29-Apr-2019. Patient was seen and evaluated around 7:20 AM on 29-Apr-2019 by me.  At that time, the patient was alert and oriented and conversant and following commands.  The patient did not have any complaints at that time.  Specifically he had denied any chest pain, shortness of breath, headache, nausea, vomiting, abdominal pain.  The patient was looking forward for discharge back to his nursing facility later that day. The patient's morning labs were still pending at the time of my evaluation initially.  His vitals have remained stable with temperature maximum 98.0, heart rate 78, respirations 20, blood pressure 140/85 with oxygen  saturation 96-98% room air. His physical examination at the time of my initial evaluation CV-RRR, no rub Lung-bibasilar rales. No wheeze.  Good air movement Abd-soft/ND/NT+BS Ext--no edema, no rash.  Around 7:40 AM as I was leaving the unit, I heard the patient holler in the room.  I went to see the patient at which time I noted the patient to have coffee-ground emesis coming out of his mouth and he was unresponsive to verbal or protopathic stimuli.  I was not able to palpate any pulses.  CODE BLUE was called immediately.  As the patient was on the monitor, I noted the patient to have sinus bradycardia with RBBB morphology which he has had since admission. ACLS protocol was instituted and carried out. The patient's family was contacted and updated on his grave medical condition. ACLS protocol was carried out for 43 minutes.  Unfortunately, the patient never really gained spontaneous circulation.  He was subsequently pronounced dead at 8:27 AM.     Pertinent Labs and Studies  Significant Diagnostic Studies DG Chest Portable 1 View  Result Date: 03/12/2019 CLINICAL DATA:  Cough history of COVID. EXAM: PORTABLE CHEST 1 VIEW COMPARISON:  04/09/2013 FINDINGS: Cardiomediastinal contours are stable with along the a shin of the heart. Lungs are hyperinflated as before. Likely with some background of pulmonary emphysema. Added interstitial prominence in the left lung base is noted on today's exam with some mild blunting of the left costodiaphragmatic sulcus. Right lung is clear. No acute bone process. IMPRESSION: 1. Findings suspicious for developing pneumonia in the left lung base. 2. Probable concomitant small left effusion. 3. COPD. Electronically Signed   By: Zetta Bills M.D.   On: 03/30/2019 18:29    Microbiology Recent Results (from the past 240 hour(s))  Urine culture     Status: Abnormal   Collection Time: 03/27/2019  5:45 PM   Specimen: Urine, Clean Catch  Result Value Ref Range Status    Specimen Description   Final    URINE, CLEAN CATCH Performed at [facility], [address] [place], [place] [address]    Special Requests   Final    NONE Performed at [facility], [address] [place], [place] [address]    Culture (A)  Final    10,000 COLONIES/mL MULTIPLE SPECIES PRESENT, SUGGEST RECOLLECTION   Report Status 04/03/2019 FINAL  Final  SARS CORONAVIRUS 2 (Samyak Sackmann 6-24 HRS) Nasopharyngeal Nasopharyngeal Swab     Status: Abnormal   Collection Time: 03/21/2019  7:33 PM   Specimen: Nasopharyngeal Swab  Result Value Ref Range Status   SARS Coronavirus 2 POSITIVE (A) NEGATIVE Final    Comment: emailed L. Berdik RN 14:15 04/02/19 ([email or web address]) (NOTE) SARS-CoV-2 target nucleic acids are DETECTED. The SARS-CoV-2 RNA is generally detectable in upper and lower respiratory specimens during the acute phase of infection. Positive results are indicative of the presence of SARS-CoV-2 RNA. Clinical correlation with patient history and other diagnostic information is  necessary to determine patient infection status. Positive results do not rule out bacterial infection or co-infection with other viruses.  The expected result is Negative. Fact Sheet for Patients: SugarRoll.be Fact Sheet for Healthcare Providers: https://www.woods-mathews.com/ This test is not yet approved or cleared by the [place] FDA and  has been authorized for detection and/or diagnosis of SARS-CoV-2 by FDA under an Emergency Use Authorization (EUA). This EUA will remain  in effect (meaning this test can be used) for the duration of the COVID-19 declaration un der Section 564(b)(1) of the Act, 21 U.S.C. section 360bbb-3(b)(1), unless the authorization is terminated or revoked sooner. Performed at [facility], [address] [place],  [place] [address]   C difficile quick scan w PCR reflex     Status: Abnormal   Collection Time: 04/02/19  5:49 AM   Specimen:  STOOL  Result Value Ref Range Status   C Diff antigen POSITIVE (A) NEGATIVE Final   C Diff toxin NEGATIVE NEGATIVE Final   C Diff interpretation Results are indeterminate. See PCR results.  Final    Comment: Performed at [facility], [address] [place], [place] [address]  C. Diff by PCR, Reflexed     Status: None   Collection Time: 04/02/19  5:49 AM  Result Value Ref Range Status   Toxigenic C. Difficile by PCR NEGATIVE NEGATIVE Final    Comment: Patient is colonized with non toxigenic C. difficile. May not need treatment unless significant symptoms are present. Performed at [facility], [address], [place], [place] [address]   MRSA PCR Screening     Status: None   Collection Time: 04/02/19  1:10 PM   Specimen: Nasopharyngeal  Result Value Ref Range Status   MRSA by PCR NEGATIVE NEGATIVE Final    Comment:        The GeneXpert MRSA Assay (FDA approved for NASAL specimens only), is one component of a comprehensive MRSA colonization surveillance program. It is not intended to diagnose MRSA infection nor to guide or monitor treatment for MRSA infections. Performed at [facility], [address], [place], [place] [address]     Lab Basic Metabolic Panel: Recent Labs  Lab 18-Apr-2019 1734 04/02/19 0406 04/03/19 0653 04/04/19 0700 04/05/19 0714  NA 140 141 141 138 142  K 6.1* 5.6* 6.0* 5.6* 5.2*  CL 108 110 110 112* 111  CO2 22 19* 19* 19* 21*  GLUCOSE 92 251* 98 109* 107*  BUN 33* 30* 28* 26* 25*  CREATININE 2.02* 1.56* 1.13 1.04 1.06  CALCIUM 8.6* 8.2* 8.6* 8.3* 8.3*  MG  --  1.7 1.7 1.8 1.8  PHOS 3.1 3.2 3.7 3.8 3.8   Liver Function Tests: Recent Labs  Lab 2019-04-18 1734 04/02/19 0406 04/03/19 0653 04/04/19 0700 04/05/19 0714  AST 79* 46* 59* 51* 43*  ALT 68* 48* 54* 58* 59*  ALKPHOS 127* 96 104 106 97  BILITOT 0.8 0.5 0.5 0.5 0.3  PROT 8.2* 6.3* 6.8 7.3 7.1  ALBUMIN 3.2* 2.5* 2.5* 2.8* 2.8*   No results for input(s): LIPASE, AMYLASE in the  last 168 hours. No results for input(s): AMMONIA in the last 168 hours. CBC: Recent Labs  Lab 04-18-19 1734 04/02/19 0406 04/03/19 0653 04/04/19 0700 04/05/19 0714  WBC 15.1* 9.0 3.9* 6.5 4.6  NEUTROABS 13.6* 7.5 3.3 5.8 3.9  HGB 13.0 11.5* 11.4* 12.1* 12.0*  HCT 42.4 37.9* 37.2* 39.3 39.0  MCV 83.1 85.2 83.8 82.9 83.9  PLT 239 171 194 207 224   Cardiac Enzymes: Recent Labs  Lab 18-Apr-2019 1734  CKTOTAL 53   Sepsis Labs: Recent Labs  Lab 04-18-19 1734 04/02/19 0406 04/03/19 0653 04/04/19 0700 04/05/19 0714  PROCALCITON 4.07 1.68  --   --  0.18  WBC 15.1* 9.0 3.9* 6.5 4.6    Procedures/Operations   Lashawn Orrego 04/09/2019, 8:47 AM

## 2019-05-05 NOTE — Progress Notes (Signed)
Patient found by provider with coffee grain black  emesis out of his mouth and unresponsive within minutes of being alert and responsive. Code blue called, provider and team of nurses active in code. Patient coded greater than . Family called by provider (Dr Tat) and made aware of patient clinical status. Patient did not regain consciousness.

## 2019-05-05 DEATH — deceased
